# Patient Record
Sex: Male | Born: 1970 | Race: White | Hispanic: No | Marital: Married | Smoking: Current some day smoker
Health system: Southern US, Community
[De-identification: ages and names within clinical notes are randomized; demographics above are authoritative.]

## PROBLEM LIST (undated history)

## (undated) DIAGNOSIS — I209 Angina pectoris, unspecified: Secondary | ICD-10-CM

## (undated) DIAGNOSIS — R7303 Prediabetes: Secondary | ICD-10-CM

## (undated) DIAGNOSIS — I10 Essential (primary) hypertension: Secondary | ICD-10-CM

## (undated) DIAGNOSIS — K219 Gastro-esophageal reflux disease without esophagitis: Secondary | ICD-10-CM

## (undated) HISTORY — PX: CARDIAC CATHETERIZATION: SHX172

## (undated) HISTORY — PX: ANTERIOR CRUCIATE LIGAMENT REPAIR: SHX115

---

## 2001-02-14 ENCOUNTER — Ambulatory Visit (HOSPITAL_COMMUNITY): Admission: RE | Admit: 2001-02-14 | Discharge: 2001-02-14 | Payer: Self-pay | Admitting: Family Medicine

## 2001-02-14 ENCOUNTER — Encounter: Payer: Self-pay | Admitting: Family Medicine

## 2001-02-17 ENCOUNTER — Encounter: Payer: Self-pay | Admitting: Family Medicine

## 2001-02-17 ENCOUNTER — Ambulatory Visit (HOSPITAL_COMMUNITY): Admission: RE | Admit: 2001-02-17 | Discharge: 2001-02-17 | Payer: Self-pay | Admitting: Family Medicine

## 2001-09-21 ENCOUNTER — Encounter: Payer: Self-pay | Admitting: Family Medicine

## 2001-09-21 ENCOUNTER — Ambulatory Visit (HOSPITAL_COMMUNITY): Admission: RE | Admit: 2001-09-21 | Discharge: 2001-09-21 | Payer: Self-pay | Admitting: Family Medicine

## 2003-07-16 ENCOUNTER — Ambulatory Visit (HOSPITAL_COMMUNITY): Admission: RE | Admit: 2003-07-16 | Discharge: 2003-07-16 | Payer: Self-pay | Admitting: Family Medicine

## 2004-01-24 ENCOUNTER — Ambulatory Visit (HOSPITAL_COMMUNITY): Admission: RE | Admit: 2004-01-24 | Discharge: 2004-01-24 | Payer: Self-pay | Admitting: Family Medicine

## 2004-03-06 ENCOUNTER — Ambulatory Visit (HOSPITAL_COMMUNITY): Admission: RE | Admit: 2004-03-06 | Discharge: 2004-03-06 | Payer: Self-pay | Admitting: Internal Medicine

## 2005-02-02 ENCOUNTER — Ambulatory Visit (HOSPITAL_COMMUNITY): Admission: RE | Admit: 2005-02-02 | Discharge: 2005-02-02 | Payer: Self-pay | Admitting: Family Medicine

## 2005-09-21 ENCOUNTER — Emergency Department (HOSPITAL_COMMUNITY): Admission: EM | Admit: 2005-09-21 | Discharge: 2005-09-21 | Payer: Self-pay | Admitting: Emergency Medicine

## 2008-05-22 ENCOUNTER — Ambulatory Visit (HOSPITAL_COMMUNITY): Admission: RE | Admit: 2008-05-22 | Discharge: 2008-05-22 | Payer: Self-pay | Admitting: Family Medicine

## 2008-05-31 ENCOUNTER — Ambulatory Visit (HOSPITAL_COMMUNITY): Admission: RE | Admit: 2008-05-31 | Discharge: 2008-05-31 | Payer: Self-pay | Admitting: Family Medicine

## 2010-10-02 NOTE — Consult Note (Signed)
NAME:  Shannon Ritter, Shannon Ritter                    ACCOUNT NO.:  [id number]   MEDICAL RECORD NO.:  [id number]          PATIENT TYPE:  OUT   LOCATION:  RDC                           FACILITY:  [facility]   PHYSICIAN:  R. Roetta Sessions, M.D. DATE OF BIRTH:  13-Jul-1970   DATE OF CONSULTATION:  02/27/2004  DATE OF DISCHARGE:  01/24/2004                                   CONSULTATION   REASON FOR CONSULTATION:  Hematochezia.   HISTORY OF PRESENT ILLNESS:  This patient is a pleasant, 40 year old  Caucasian male sent over at the courtesy of Dr. Karleen Hampshire to further  evaluate a 1-year history of intermittent gross blood per rectum.  The  patient tells me that he has alternating constipation from time to time,  particularly when he has diarrhea sometimes he has blood per rectum  associated with loose stools.  He tells me that he may have had these  symptoms now for over a year.  They are intermittent; they are not every  day, but a couple of times weekly.  He has not had any associated abdominal  pain, although sometimes he does have some mild cramping after he eats.   FAMILY HISTORY:  Family history is significant in that a paternal uncle was  recently diagnosed with colorectal cancer in his early 48s.  Patient has  never had his lower GI tract imaged. He does not have any upper GI tract  symptoms such as odynophagia, dysphagia, early satiety, or reflux symptoms,  nausea or vomiting.  He has not lost any weight.   PAST MEDICAL HISTORY:  Significant for hypertension and insomnia.   PAST SURGERIES:  None.   CURRENT MEDICATIONS:  1.  Teoprolol 100 mg daily.  2.  Xanax 1 mg q.h.s. p.r.n. sleep.   ALLERGIES:  No known drug allergies.   FAMILY HISTORY:  Father has lung cancer.  Mother has high blood pressure and  diabetes.   SOCIAL HISTORY:  The patient is single.  He is a Theme park manager.  He  smokes 1 pack of cigarettes every other day.  He drinks a 12-pack of beer,  at one time, approximately  once weekly.   REVIEW OF SYSTEMS:  No chest pain, no dyspnea on exertion.  No fever or  chills.  Otherwise, as in history of present illness.   PHYSICAL EXAMINATION:  GENERAL:  Reveals a pleasant, 40 year old gentleman  with a goatee resting comfortably.  VITAL SIGNS:  Weight 197, height 5 feet 11 inches.  Temperature 98.1, BP  130/90, pulse 64.  SKIN:  Warm and dry.  No jaundice.  No cutaneous stigmata of chronic liver  disease.  HEENT:  No scleral icterus.  Conjunctivae are pink.  Oral cavity no lesions.  NECK:  JVD is not prominent.  CHEST:  Lungs are clear to auscultation.  CARDIAC:  Regular rate and rhythm without murmur, gallop, or rub.  ABDOMEN:  Nondistended, positive bowel sounds.  Soft, nontender, without  appreciable mass or organomegaly.  EXTREMITIES:  No edema.  RECTAL:  Deferred to the time of colonoscopy.  IMPRESSION:  This patient is a pleasant 40 year old gentleman with  intermittent rectal bleeding (gross blood per rectum) in association with  alternating constipation and diarrhea.  It sounds as though his bleeding is  coming from the distal lower gastrointestinal tract.  We need to rule out  other significant pathology.  I doubt his bleeding is secondary to colitis;  but, again, he needs further investigation.  He has a positive family  history of colorectal neoplasia in a second-degree relative.   RECOMMENDATIONS:  1.  Colonoscopy in the near future at [facility]. I have discussed      the potential risks, benefits, and alternatives.  His questions were      answered.  He is agreeable. I will set this up in the very near future.  2.  Further recommendations to follow.   I would like to thank Dr. Karleen Hampshire for allowing me to see this nice  gentleman today.     Otelia Sergeant   RMR/MEDQ  D:  02/27/2004  T:  02/27/2004  Job:  [id number]   cc:   Kirk Ruths, M.D.  [address]  [place]  [place] [address]  Fax: [phone]   R. Roetta Sessions, M.D.   [address]  [place]  [place] [address]  Fax: [phone]

## 2010-10-02 NOTE — Op Note (Signed)
NAME:  Shannon Ritter, Shannon Ritter                    ACCOUNT NO.:  [id number]   MEDICAL RECORD NO.:  [id number]          PATIENT TYPE:  AMB   LOCATION:  DAY                           FACILITY:  [facility]   PHYSICIAN:  R. Roetta Sessions, M.D. DATE OF BIRTH:  [birth year]   DATE OF PROCEDURE:  03/06/2004  DATE OF DISCHARGE:                                 OPERATIVE REPORT   PROCEDURE:  Colonoscopy with biopsy.   INDICATIONS FOR PROCEDURE:  The patient is a 40 year old gentleman with  hematochezia, worse when he consumes significant quantities of beer. No  family history of colorectal neoplasia. Colonoscopy is now being done. This  approach has been discussed with the patient at length. Potential risks,  benefits, and alternatives have been reviewed and questions answered. He is  agreeable. Please see documentation in medical record for more information.   PROCEDURE NOTE:  O2 saturation, blood pressure, pulse, and respirations  monitored throughout the entirety of the procedure. Conscious sedation with  Versed 5 mg IV and Demerol 100 mg IV in divided doses.   INSTRUMENT:  Olympus video chip system.   FINDINGS:  Digital rectal examination revealed no abnormalities.   ENDOSCOPIC FINDINGS:  Prep was good.   Rectum:  Examination of the rectal mucosa including retroflexed view of anal  verge revealed  only some internal hemorrhoids.   Colon:  Colonic mucosa was surveyed from the rectosigmoid junction through  the left, transverse, and right colon to area of the appendiceal orifice,  ileocecal valve, and cecum. These structures were well seen and photographed  for the record. Olympus video scope was slowly withdrawn, and all previously  mentioned mucosal surfaces were again seen. The patient was noted to have a  3-mm polyp at the base of the cecum which was cold biopsied/removed.  Remainder of colonic mucosa appeared normal. The patient tolerated the  procedure well and was reactive to endoscopy.    IMPRESSION:  1.  Internal hemorrhoids, otherwise normal rectum.  2.  Diminutive polyp in the cecum, cold biopsied/removed. Remainder of      colonic mucosa appeared normal.  3.  I suspect the patient has bleed from hemorrhoids intermittently.   RECOMMENDATIONS:  1.  Hemorrhoid literature provided to Mr. Wollard.  2.  A 10-day course of Anusol HC suppositories 1 per rectum at bedtime.  3.  Follow up on pathology.  4.  Further recommendations to follow.     Otelia Sergeant   RMR/MEDQ  D:  03/06/2004  T:  03/06/2004  Job:  [id number]   cc:   Kirk Ruths, M.D.  [address]  [place]  [place] [address]  Fax: [phone]

## 2010-10-02 NOTE — H&P (Signed)
NAMEDONNALD, TABAR                    ACCOUNT NO.:  [id number]   MEDICAL RECORD NO.:  [id number]           PATIENT TYPE:   LOCATION:                                 FACILITY:   PHYSICIAN:  Vonna Kotyk R. Jacinto Halim, MD       DATE OF BIRTH:  29-Sep-1970   DATE OF ADMISSION:  09/21/2005  DATE OF DISCHARGE:                                HISTORY & PHYSICAL   CHIEF COMPLAINTS:  Chest pain.   HISTORY OF PRESENT ILLNESS:  The patient is a 40 year old male who was  referred to [facility] ER today for chest pain.  He was transferred from Dr.  Sharyon Medicus office by ambulance.  He has no prior history of coronary disease or  MI, but he was admitted to a hospital in [place] for chest pain a  couple years ago.  He was diagnosed with gastroesophageal reflux.  He thinks  he had a stress test a couple of years ago in [place], but we have no  records of that.  He is seen today for a left-sided chest pain.  Symptoms  have been present since Sunday.  Symptoms were worse with palpation.  He had  some left arm numbness and posterior neck discomfort associated with it.  He  has had no significant dyspnea.  His EKG at Dr. Sharyon Medicus office was mildly  abnormal with T-wave inversion in aVF.   PAST MEDICAL HISTORY:  Remarkable for:  1.  Hypertension.  2.  Gastroesophageal reflux.  3.  Hyperlipidemia which is untreated.   CURRENT MEDICATIONS:  1.  Toprol XL 100 mg a day.  2.  Protonix p.r.n.  3.  Xanax p.r.n..   He has no known drug allergies.   SOCIAL HISTORY:  He is engaged.  He works in Airline pilot at [employer].  He is  smokes occasionally.  He does drink significant amount of alcohol.  Apparently he had 18 beers this weekend.   FAMILY HISTORY:  Remarkable for hypertension but no coronary disease.   REVIEW OF SYSTEMS:  Essentially unremarkable except for above.  He denies  any GI bleeding or melena.  He has not had syncope or tachycardia.  He has  had occasional palpitations.   PHYSICAL EXAMINATION:  VITAL SIGNS:  Blood pressure 132/88, pulse 68,  respirations 12.  GENERAL:  He is a well-developed, well-nourished male in no acute distress.  HEENT: Normocephalic, atraumatic.  Lids and conjunctivae within normal  limits.  NECK:  Without bruits and without JVD.  CHEST: Clear to auscultation and percussion. He does have some tenderness to  palpation in the left anterior chest.  CARDIAC:  Reveals regular rate and rhythm without obvious murmur, rub or  gallop.  Normal S1 and S2.  ABDOMEN:  Nontender.  No hepatosplenomegaly.  EXTREMITIES: Without edema.  Distal pulses are 3+/4 bilaterally.  NEUROLOGIC: Exam exam is grossly intact.   EKG shows sinus rhythm with nonspecific ST changes.   IMPRESSION:  1.  Chest pain, some atypical features.  2.  Hypertension.  3.  Abnormal electrocardiogram at his primary care's office, now  showing      nonspecific ST changes.  4.  Heavy alcohol use.  5.  Hyperlipidemia by history  6.  History of smoking.   PLAN:  The patient was seen by Dr. Jacinto Halim and myself today in the ER.  We  obtained labs, chest x-ray, and EKG.  Will go ahead and get point-of-care  markers.  If his troponins are negative, he will be discharged and will come  to the office this Thursday for a stress test.  It is okay for him to hold  his beta blocker until them.      Abelino Derrick, P.A.      Cristy Hilts. Jacinto Halim, MD  Electronically Signed    LKK/MEDQ  D:  09/21/2005  T:  09/21/2005  Job:  [id number]   cc:   Kirk Ruths, M.D.  Fax: [phone]

## 2011-06-14 ENCOUNTER — Other Ambulatory Visit: Payer: Self-pay | Admitting: Cardiology

## 2011-06-14 ENCOUNTER — Ambulatory Visit
Admission: RE | Admit: 2011-06-14 | Discharge: 2011-06-14 | Disposition: A | Discharge status: Home / Self Care | Payer: Managed Care, Other (non HMO) | Source: Ambulatory Visit | Attending: Cardiology | Admitting: Cardiology

## 2011-12-20 DIAGNOSIS — R42 Dizziness and giddiness: Secondary | ICD-10-CM

## 2015-04-01 ENCOUNTER — Ambulatory Visit (HOSPITAL_COMMUNITY)
Admission: RE | Admit: 2015-04-01 | Discharge: 2015-04-01 | Disposition: A | Discharge status: Home / Self Care | Payer: Managed Care, Other (non HMO) | Source: Ambulatory Visit | Attending: Family Medicine | Admitting: Family Medicine

## 2015-04-01 ENCOUNTER — Other Ambulatory Visit (HOSPITAL_COMMUNITY): Payer: Self-pay | Admitting: Family Medicine

## 2015-04-01 DIAGNOSIS — S70352S Superficial foreign body, left thigh, sequela: Secondary | ICD-10-CM | POA: Diagnosis not present

## 2015-04-01 DIAGNOSIS — W208XXS Other cause of strike by thrown, projected or falling object, sequela: Secondary | ICD-10-CM | POA: Insufficient documentation

## 2016-01-08 ENCOUNTER — Ambulatory Visit: Payer: Managed Care, Other (non HMO) | Admitting: Cardiovascular Disease

## 2016-04-20 ENCOUNTER — Telehealth: Payer: Self-pay | Admitting: Cardiovascular Disease

## 2016-04-20 NOTE — Telephone Encounter (Signed)
Received records from [facility] for appointment on 04/30/16 with Dr Gwenlyn Found.  Records given to [facility] (medical records) for Dr Kennon Holter schedule on 04/30/16. lp

## 2016-04-30 ENCOUNTER — Encounter: Payer: Self-pay | Admitting: Cardiovascular Disease

## 2016-04-30 ENCOUNTER — Ambulatory Visit (INDEPENDENT_AMBULATORY_CARE_PROVIDER_SITE_OTHER): Payer: Managed Care, Other (non HMO) | Admitting: Cardiovascular Disease

## 2016-04-30 VITALS — BP 152/98 | HR 81 | Ht 71.0 in | Wt 208.0 lb

## 2016-04-30 DIAGNOSIS — E7800 Pure hypercholesterolemia, unspecified: Secondary | ICD-10-CM

## 2016-04-30 DIAGNOSIS — R079 Chest pain, unspecified: Secondary | ICD-10-CM | POA: Insufficient documentation

## 2016-04-30 DIAGNOSIS — I2089 Other forms of angina pectoris: Secondary | ICD-10-CM

## 2016-04-30 DIAGNOSIS — I208 Other forms of angina pectoris: Secondary | ICD-10-CM

## 2016-04-30 DIAGNOSIS — E785 Hyperlipidemia, unspecified: Secondary | ICD-10-CM | POA: Insufficient documentation

## 2016-04-30 DIAGNOSIS — I10 Essential (primary) hypertension: Secondary | ICD-10-CM

## 2016-04-30 MED ORDER — BLOOD PRESSURE MONITOR/ARM DEVI: 1.0000 | Freq: Every day | 0 refills | Status: DC

## 2016-04-30 NOTE — Assessment & Plan Note (Signed)
History of atypical chest pain with chronic Rosacea performed 10 years ago by Dr. Nadyne Coombes  which is apparently normal. He had admission at [facility] June for chest pain rule out myocardial infarction. A stress echo was normal performed 11/11/15. He is on Protonix which she has not taken.

## 2016-04-30 NOTE — Progress Notes (Signed)
04/30/2016 LAQUINTON TOOLE   11/14/1970  [id number]  Primary Physician Glo Herring., MD Primary Cardiologist: Lorretta Harp MD Renae Gloss  HPI:  Mr. Wo is a 45 year old mildly overweight married Caucasian male father to accompany by his wife Helene Kelp. He is referred by Dr. Riley Kill for cardiovascular evaluation because of hypertension and atypical chest pain. Suspect factors include occasional tobacco abuse of 4 cigarettes a day when he drinks several times a week. History of hypertension and hyperlipidemia. He has no had a heart attack or stroke. He apparently underwent cardiac catheterization by Dr. Einar Gip 10 years ago and had no evidence of CAD. He has had off-and-on chest pain since that time. He is in a high stress job selling homes. He had a chest pain rule out myocardial infarction admission at [facility] in June of this year. At stress echo performed on 11/11/2015 was normal.   Current Outpatient Prescriptions  Medication Sig Dispense Refill  . aspirin 81 MG tablet Take 81 mg by mouth daily.    Marland Kitchen atorvastatin (LIPITOR) 40 MG tablet Take 40 mg by mouth daily.    Marland Kitchen lisinopril (PRINIVIL,ZESTRIL) 20 MG tablet Take 20 mg by mouth daily.    . Multiple Vitamins-Minerals (MULTIVITAMIN ADULT PO) Take 1 tablet by mouth daily.    Marland Kitchen ALPRAZolam (XANAX) 1 MG tablet Take 1 mg by mouth at bedtime.    . Blood Pressure Monitoring (BLOOD PRESSURE MONITOR/ARM) DEVI 1 Device by Does not apply route daily. 1 Device 0  . pantoprazole (PROTONIX) 40 MG tablet Take 40 mg by mouth daily.     No current facility-administered medications for this visit.     Not on File  Social History   Social History  . Marital status: Legally Separated    Spouse name: N/A  . Number of children: N/A  . Years of education: N/A   Occupational History  . Not on file.   Social History Main Topics  . Smoking status: Not on file  . Smokeless tobacco: Not on file  . Alcohol use Not on  file  . Drug use: Unknown  . Sexual activity: Not on file   Other Topics Concern  . Not on file   Social History Narrative  . No narrative on file     Review of Systems: General: negative for chills, fever, night sweats or weight changes.  Cardiovascular: negative for chest pain, dyspnea on exertion, edema, orthopnea, palpitations, paroxysmal nocturnal dyspnea or shortness of breath Dermatological: negative for rash Respiratory: negative for cough or wheezing Urologic: negative for hematuria Abdominal: negative for nausea, vomiting, diarrhea, bright red blood per rectum, melena, or hematemesis Neurologic: negative for visual changes, syncope, or dizziness All other systems reviewed and are otherwise negative except as noted above.    Blood pressure (!) 152/98, pulse 81, height 5\' 11"  (1.803 m), weight 208 lb (94.3 kg).  General appearance: alert and no distress Neck: no adenopathy, no carotid bruit, no JVD, supple, symmetrical, trachea midline and thyroid not enlarged, symmetric, no tenderness/mass/nodules Lungs: clear to auscultation bilaterally Heart: regular rate and rhythm, S1, S2 normal, no murmur, click, rub or gallop Extremities: extremities normal, atraumatic, no cyanosis or edema  EKG sinus rhythm 81 without ST or T-wave changes. I personally reviewed this EKG  ASSESSMENT AND PLAN:   Essential hypertension History of hypertension with blood pressures measured today at 152/98. He is on low-dose lisinopril. He is clearly mildly overweight, does not exercise and has  a fairly poor diet. I've asked him to purchase a home digital blood pressure cuff and keep a blood pressure log over the next one month. He will see Erasmo Downer back after that to make further recommendations  Hyperlipidemia History of hyperlipidemia on statin therapy. He has a poor diet. We will recheck a lipid liver profile in 2 months.    Chest pain History of atypical chest pain with chronic Rosacea  performed 10 years ago by Dr. Nadyne Coombes  which is apparently normal. He had admission [facility] for chest pain rule out myocardial infarction. A stress echo was normal performed 11/11/15. He is on Protonix which she has not taken.      Lorretta Harp MD FACP,FACC,FAHA, [facility] 04/30/2016 3:49 PM

## 2016-04-30 NOTE — Patient Instructions (Signed)
Medication Instructions: Your physician recommends that you continue on your current medications as directed. Please refer to the Current Medication list given to you today.  I sent a prescription to your pharmacy for a BP monitor. Your physician has requested that you regularly monitor and record your blood pressure readings at home. Please use the same machine at the same time of day to check your readings and record them to bring to your follow-up visit.  Labwork: Your physician recommends that you return for a FASTING lipid profile and hepatic function panel in 2 months.   Follow-Up: Your physician recommends that you schedule a follow-up appointment in 1 month with Pharmacy.  Your physician wants you to follow-up in: 6 months with Dr. Gwenlyn Found. You will receive a reminder letter in the mail two months in advance. If you don't receive a letter, please call our office to schedule the follow-up appointment.  If you need a refill on your cardiac medications before your next appointment, please call your pharmacy.

## 2016-04-30 NOTE — Assessment & Plan Note (Signed)
History of hypertension with blood pressures measured today at 152/98. He is on low-dose lisinopril. He is clearly mildly overweight, does not exercise and has a fairly poor diet. I've asked him to purchase a home digital blood pressure cuff and keep a blood pressure log over the next one month. He will see Erasmo Downer back after that to make further recommendations

## 2016-04-30 NOTE — Assessment & Plan Note (Signed)
History of hyperlipidemia on statin therapy. He has a poor diet. We will recheck a lipid liver profile in 2 months.

## 2016-05-03 ENCOUNTER — Other Ambulatory Visit: Payer: Self-pay | Admitting: Cardiovascular Disease

## 2016-05-03 DIAGNOSIS — I10 Essential (primary) hypertension: Secondary | ICD-10-CM

## 2016-05-03 MED ORDER — BLOOD PRESSURE MONITOR/ARM DEVI: 1.0000 | Freq: Once | 0 refills | Status: DC

## 2016-05-26 ENCOUNTER — Ambulatory Visit: Payer: Managed Care, Other (non HMO) | Admitting: Cardiovascular Disease

## 2016-05-27 ENCOUNTER — Telehealth: Payer: Self-pay | Admitting: Cardiovascular Disease

## 2016-05-27 NOTE — Telephone Encounter (Signed)
New message   Pt verbalized that he is calling to speak to rn about medication no issues- he has a week worth of readings and pt said that he is suppose to take a full month

## 2016-05-27 NOTE — Telephone Encounter (Signed)
Returned call to patient-patient reports he has appt on 1/16 with pharmacist for BP check but has only been able to monitor BP at home for about a week due to issues getting a BP cuff.  Patient wondering if we need to move appt to a later date so he has more BP readings.  Patient also reports concern with BP cuff accuracy at home.   Patient will be out of town week of Jan 22-Advised patient I could move appointment to the end of next week so he would have atleast 2 weeks of BP readings.  Appointment rescheduled.  Also advised to bring BP cuff with him to see if it is accurate.  Pt agreed and verbalized understanding.

## 2016-06-01 ENCOUNTER — Ambulatory Visit: Payer: Managed Care, Other (non HMO)

## 2016-06-04 ENCOUNTER — Ambulatory Visit (INDEPENDENT_AMBULATORY_CARE_PROVIDER_SITE_OTHER): Payer: Managed Care, Other (non HMO) | Admitting: Pharmacist Clinician (PhC)/ Clinical Pharmacy Specialist

## 2016-06-04 DIAGNOSIS — I10 Essential (primary) hypertension: Secondary | ICD-10-CM | POA: Diagnosis not present

## 2016-06-04 NOTE — Patient Instructions (Addendum)
Return for a a follow up appointment in 4 weeks  Your blood pressure today is 108/82  Check your blood pressure at home daily and keep record of the readings.  Take your BP meds as follows:  Continue with lisinopril daily  Bring all of your meds, your BP cuff and your record of home blood pressures to your next appointment.  Exercise as you're able, try to walk approximately 30 minutes per day.  Keep salt intake to a minimum, especially watch canned and prepared boxed foods.  Eat more fresh fruits and vegetables and fewer canned items.  Avoid eating in fast food restaurants.    HOW TO TAKE YOUR BLOOD PRESSURE: . Rest 5 minutes before taking your blood pressure. .  Don't smoke or drink caffeinated beverages for at least 30 minutes before. . Take your blood pressure before (not after) you eat. . Sit comfortably with your back supported and both feet on the floor (don't cross your legs). . Elevate your arm to heart level on a table or a desk. . Use the proper sized cuff. It should fit smoothly and snugly around your bare upper arm. There should be enough room to slip a fingertip under the cuff. The bottom edge of the cuff should be 1 inch above the crease of the elbow. . Ideally, take 3 measurements at one sitting and record the average.   0

## 2016-06-04 NOTE — Progress Notes (Signed)
     06/04/2016 Shannon Ritter 11-May-1971 [id number]   HPI:  Shannon Ritter is a 46 y.o. male patient of Dr Gwenlyn Found, with a PMH below who presents today for hypertension clinic evaluation.  He reports a history of hypertension going back about 10-15 years, although he admits to not paying attention to readings for many of those years.  When he saw Dr. Gwenlyn Found his pressure was 152/98.  No changes were made to his medication (lisinopirl 20 mg daily), but he was encouraged to lose weight and increase his exercise.  He actually did this, and notes a 10 pound weight loss over the past month, as well as having started using an exercise bike most days of the week.    Blood Pressure Goal:  130/80   Current Medications:  Lisinopril 20 mg daily  Cardiac Hx:  Hypertension, hyperlipidemia  Family Hx:  Mother has hypertension and diabetes.  Father had double lung transplant around age 57, lived for 15 years before dying of cancer.  Social Hx:  Previously was drinking 3-4 beers per day.   Since seeing MD has cut back, states has not had a beer in 12 days.  (although he will be in [facility] next week and admits will be doing some drinking);   Diet:  Has improved greatly since seeing MD.  Dinner mostly at home with his family, has switched to salmon and [place], cut out fried foods for the most part.  Previously lunch was fast food, now choosing [employer] healthy grilled meats or other non-fried meats from other fast food.    Exercise:  Recently got exercise bike, rides for 45 minutes most days  Home BP readings:  Borrowed cuff from his mother, admits it is probably 20-58 years old.  Average reading (of 16) 135/92, with a range of 120-158/81-100.  Of note, in the past 6 readings only 1 had systolic > [id number].    Intolerances:   none  Wt Readings from Last 3 Encounters:  04/30/16 208 lb (94.3 kg)   BP Readings from Last 3 Encounters:  04/30/16 (!) 152/98   Pulse Readings from Last 3 Encounters:  04/30/16 81     Current Outpatient Prescriptions  Medication Sig Dispense Refill  . ALPRAZolam (XANAX) 1 MG tablet Take 1 mg by mouth at bedtime.    Marland Kitchen aspirin 81 MG tablet Take 81 mg by mouth daily.    Marland Kitchen atorvastatin (LIPITOR) 40 MG tablet Take 40 mg by mouth daily.    . Blood Pressure Monitoring (BLOOD PRESSURE MONITOR/ARM) DEVI 1 Device by Does not apply route once. 1 Device 0  . lisinopril (PRINIVIL,ZESTRIL) 20 MG tablet Take 20 mg by mouth daily.    . Multiple Vitamins-Minerals (MULTIVITAMIN ADULT PO) Take 1 tablet by mouth daily.    . pantoprazole (PROTONIX) 40 MG tablet Take 40 mg by mouth daily.     No current facility-administered medications for this visit.     Not on File  No past medical history on file.  There were no vitals taken for this visit.  No problem-specific Assessment & Plan notes found for this encounter.   Tommy Medal PharmD CPP [facility]

## 2016-06-04 NOTE — Assessment & Plan Note (Signed)
Patient with hypertension, no problems with compliance or complaints about medication.  He does endorse a cough/tickle in his throat, but states it is not problematic, and he has continual problems with allergies/sinuses.  Pressure in the office much improved, and he was praised on his weight loss and dietary changes.  I will have him continue to monitor home BP readings and see him again in 1 month.   Should he continue to lose weight and exercise, we may be able to actually decrease his lisinopril dose.

## 2016-06-07 ENCOUNTER — Encounter: Payer: Self-pay | Admitting: Pharmacist Clinician (PhC)/ Clinical Pharmacy Specialist

## 2016-07-02 ENCOUNTER — Ambulatory Visit: Payer: Managed Care, Other (non HMO)

## 2016-07-21 ENCOUNTER — Ambulatory Visit (INDEPENDENT_AMBULATORY_CARE_PROVIDER_SITE_OTHER): Payer: Managed Care, Other (non HMO) | Admitting: Pharmacist

## 2016-07-21 VITALS — BP 130/92 | HR 77

## 2016-07-21 DIAGNOSIS — I10 Essential (primary) hypertension: Secondary | ICD-10-CM

## 2016-07-21 NOTE — Progress Notes (Signed)
Patient ID: Shannon Ritter                 DOB: 1970/05/22                      MRN: [id number]     HPI: Shannon Ritter is a 46 y.o. male patient of Dr Gwenlyn Found, with a PMH relevant for hypertension and hyperlipidemia.  He reports a history of hypertension going back about 10-15 years, although he admits to not paying attention to readings for many of those years.  When he saw Dr. Gwenlyn Found his pressure was 152/98.  No changes were made to his medication (Lisinopril 20 mg daily), but he was encouraged to lose weight and increase his exercise.  He is currently following up with a nutritionist and report a weight loss of 22 pound (19% of total body wt) over  2 months.  Continues to exercise with stationary bike 2-3 times a week for 40-45 minutes. Alcohol intake also significantly decreased from 3-4 beers a day to only few drink occasionally (twice in the last 2 months).  Presents to clinic today for HTN follow up and medication management. Denies headaches, fatigue or swelling. Occasional dizziness reported but never complete loss of balance.  Patient dislike taking medication and will like to decrease amount of daily medication he takes.  Blood Pressure Goal:  130/80   Current Medications: Lisinopril 20 mg daily  Family History: Mother has hypertension and diabetes.  Father had double lung transplant around age 56, lived for 15 years before dying of cancer.  Social Hx: Previously was drinking 3-4 beers per day.   Since seeing MD has cut back, states has not had a beer in 12 days.  (although he will be in [facility] next week and admits will be doing some drinking);   Diet: Has improved greatly since seeing MD.  Dinner mostly at home with his family, has switched to salmon and [place], cut out fried foods for the most part.  Previously lunch was fast food, now choosing [employer] healthy grilled meats or other non-fried meats from other fast food.    Exercise: Recently got exercise bike, rides for 45 minutes  most days  Home BP readings: 22 reading; 123/87 average (range 107-145/76-102) **Noted BP elevated the day after significant EtOH intake**  Wt Readings from Last 3 Encounters:  04/30/16 208 lb (94.3 kg)   BP Readings from Last 3 Encounters:  07/21/16 (!) 130/92  04/30/16 (!) 152/98   Pulse Readings from Last 3 Encounters:  07/21/16 77  04/30/16 81     Current Outpatient Prescriptions on File Prior to Visit  Medication Sig Dispense Refill  . ALPRAZolam (XANAX) 1 MG tablet Take 1 mg by mouth at bedtime.    Marland Kitchen aspirin 81 MG tablet Take 81 mg by mouth daily.    Marland Kitchen atorvastatin (LIPITOR) 40 MG tablet Take 40 mg by mouth daily.    . Blood Pressure Monitoring (BLOOD PRESSURE MONITOR/ARM) DEVI 1 Device by Does not apply route once. 1 Device 0  . lisinopril (PRINIVIL,ZESTRIL) 20 MG tablet Take 20 mg by mouth daily.    . Multiple Vitamins-Minerals (MULTIVITAMIN ADULT PO) Take 1 tablet by mouth daily.    . pantoprazole (PROTONIX) 40 MG tablet Take 40 mg by mouth daily.     No current facility-administered medications on file prior to visit.     Blood pressure (!) 130/92, pulse 77, SpO2 95 %.  Essential hypertension:  Blood  pressure today slightly above desired goal but significantly improved from previous readings. Patient stated his BP is always elevated after EtOH consumption and had few drinks last night.  Home BP readings are at goal with previously calibrated home device and considered to be accurate with average of 123/87.  Also noted occasional dizziness on standing reported. Patient insistent on decreasing the amount of "pills he takes every day".  He is currently following all life style modifications recommended including significant weight loss of 22 lbs so far.   Will decrease his Lisinopril from 20mg  to 10mg  daily and decrease Protonix frequency from twice daily to once daily.  Follow up with HTN clinic in 1 months to consider Lisinopril discontinuation if possible.  Kyisha Fowle  Rodriguez-Guzman PharmD, [place] [id number] 07/22/2016 8:52 AM

## 2016-07-21 NOTE — Patient Instructions (Addendum)
Return for a  follow up appointment in 1 months  Your blood pressure today is 130/92 pulse 77  Check your blood pressure at home daily (if able) and keep record of the readings.  Take your BP meds as follows: Decrease lisinopril to 10mg  daily (1/2 tablet of 20 mg until supply completed)  **Repeat blood work (fasting) within 1 week **Decrease Protonix to once daily for 1 week, then discontinue if no reflux    Bring all of your meds, your BP cuff and your record of home blood pressures to your next appointment.  Exercise as you're able, try to walk approximately 30 minutes per day.  Keep salt intake to a minimum, especially watch canned and prepared boxed foods.  Eat more fresh fruits and vegetables and fewer canned items.  Avoid eating in fast food restaurants.    HOW TO TAKE YOUR BLOOD PRESSURE: . Rest 5 minutes before taking your blood pressure. .  Don't smoke or drink caffeinated beverages for at least 30 minutes before. . Take your blood pressure before (not after) you eat. . Sit comfortably with your back supported and both feet on the floor (don't cross your legs). . Elevate your arm to heart level on a table or a desk. . Use the proper sized cuff. It should fit smoothly and snugly around your bare upper arm. There should be enough room to slip a fingertip under the cuff. The bottom edge of the cuff should be 1 inch above the crease of the elbow. . Ideally, take 3 measurements at one sitting and record the average.

## 2016-08-18 ENCOUNTER — Ambulatory Visit: Payer: Managed Care, Other (non HMO)

## 2016-08-18 NOTE — Progress Notes (Deleted)
Patient ID: Shannon Ritter                 DOB: Feb 25, 1971                      MRN: [id number]     HPI: MARKON JARES is a 46 y.o. male patient of Dr Gwenlyn Found, with a PMH relevant for hypertension and hyperlipidemia.  He reports a history of hypertension going back about 10-15 years, although he admits to not paying attention to readings for many of those years.  When he saw Dr. Gwenlyn Found his pressure was 152/98.  No changes were made to his medication (Lisinopril 20 mg daily), but he was encouraged to lose weight and increase his exercise.  He is currently following up with a nutritionist and report a weight loss of 22 pound (19% of total body wt) over  2 months.  Continues to exercise with stationary bike 2-3 times a week for 40-45 minutes. Alcohol intake also significantly decreased from 3-4 beers a day to only few drink occasionally (twice in the last 2 months).  Presents to clinic today for HTN follow up and medication management. Denies headaches, fatigue or swelling. Occasional dizziness reported but never complete loss of balance.  Patient dislike taking medication and will like to decrease amount of daily medication he takes.  Blood Pressure Goal:  130/80   Current Medications: Lisinopril 10 mg daily  Family History: Mother has hypertension and diabetes.  Father had double lung transplant around age 69, lived for 15 years before dying of cancer.  Social Hx: Previously was drinking 3-4 beers per day.   Since seeing MD has cut back, states has not had a beer in 12 days.  (although he will be in [facility] next week and admits will be doing some drinking);   Diet: Has improved greatly since seeing MD.  Dinner mostly at home with his family, has switched to salmon and [place], cut out fried foods for the most part.  Previously lunch was fast food, now choosing [employer] healthy grilled meats or other non-fried meats from other fast food.    Exercise: Recently got exercise bike, rides for 45 minutes  most days  Home BP readings: 22 reading; 123/87 average (range 107-145/76-102) **Noted BP elevated the day after significant EtOH intake**  Wt Readings from Last 3 Encounters:  04/30/16 208 lb (94.3 kg)   BP Readings from Last 3 Encounters:  07/21/16 (!) 130/92  04/30/16 (!) 152/98   Pulse Readings from Last 3 Encounters:  07/21/16 77  04/30/16 81     Current Outpatient Prescriptions on File Prior to Visit  Medication Sig Dispense Refill  . ALPRAZolam (XANAX) 1 MG tablet Take 1 mg by mouth at bedtime.    Marland Kitchen aspirin 81 MG tablet Take 81 mg by mouth daily.    Marland Kitchen atorvastatin (LIPITOR) 40 MG tablet Take 40 mg by mouth daily.    . Blood Pressure Monitoring (BLOOD PRESSURE MONITOR/ARM) DEVI 1 Device by Does not apply route once. 1 Device 0  . lisinopril (PRINIVIL,ZESTRIL) 20 MG tablet Take 10 mg by mouth daily.    . Multiple Vitamins-Minerals (MULTIVITAMIN ADULT PO) Take 1 tablet by mouth daily.    . pantoprazole (PROTONIX) 40 MG tablet Take 40 mg by mouth daily.     No current facility-administered medications on file prior to visit.     There were no vitals taken for this visit.  Essential hypertension:  Blood pressure  today slightly above desired goal but significantly improved from previous readings. Patient stated his BP is always elevated after EtOH consumption and had few drinks last night.  Home BP readings are at goal with previously calibrated home device and considered to be accurate with average of 123/87.  Also noted occasional dizziness on standing reported. Patient insistent on decreasing the amount of "pills he takes every day".  He is currently following all life style modifications recommended including significant weight loss of 22 lbs so far.   Will decrease his Lisinopril from 20mg  to 10mg  daily and decrease Protonix frequency from twice daily to once daily.  Follow up with HTN clinic in 1 months to consider Lisinopril discontinuation if possible.  Raquel  Rodriguez-Guzman PharmD, [place] [id number] 08/18/2016 7:12 AM

## 2016-08-23 NOTE — Progress Notes (Signed)
Patient ID: Shannon Ritter                 DOB: [birth year]                      MRN: [id number]     HPI: Shannon Ritter is a 46 y.o. male patient of Dr Gwenlyn Found, with a PMH relevant for hypertension and hyperlipidemia.  He reports a history of hypertension going back about 10-15 years, although he admits to not paying attention to readings for many of those years.  When he saw Dr. Gwenlyn Found his pressure was 152/98.   He is currently following up with a nutritionist and report a weight loss of 36 pound over 3 months.  Continues to exercise with stationary bike 2-3 times a week for 40-45 minutes and started to run 1-2x times per week. Alcohol intake also significantly decreased from 3-4 beers a day to only few drink occasionally (twice in the last 2 months).  Presents to clinic today for HTN follow up . Denies headaches, fatigue or swelling. Patient dislike taking medication and will like to decrease amount of daily medication he takes.  Blood Pressure Goal: 130/80  Current Medications: Lisinopril 10 mg daily  Family History: Mother has hypertension and diabetes. Father had double lung transplant around age 79, lived for 15 years before dying of cancer.  Social Hx: Previously was drinking 3-4 beers per day. Since seeing MD has cut back to only 2-3 per month.  Diet: Has improved greatly since seeing MD. Dinner mostly at home with his family, has switched to salmon and [place], cut out fried foods for the most part. Previously lunch was fast food, now choosing [employer] healthy grilled meats or other non-fried meats from other fast food.   Exercise: Stationary bike for ~ 45 minutes most days, running 2-3x per week and start playing softball at [facility] BP readings:  18 readings; 114/80 average (range 101-125/71-90); pulse 66-87  Wt Readings from Last 3 Encounters:  04/30/16 208 lb (94.3 kg)   BP Readings from Last 3 Encounters:  08/24/16 118/78  07/21/16 (!) 130/92  04/30/16 (!) 152/98    Pulse Readings from Last 3 Encounters:  08/24/16 73  07/21/16 77  04/30/16 81    Current Outpatient Prescriptions on File Prior to Visit  Medication Sig Dispense Refill  . ALPRAZolam (XANAX) 1 MG tablet Take 1 mg by mouth at bedtime.    Marland Kitchen aspirin 81 MG tablet Take 81 mg by mouth daily.    Marland Kitchen atorvastatin (LIPITOR) 40 MG tablet Take 40 mg by mouth daily.    . Blood Pressure Monitoring (BLOOD PRESSURE MONITOR/ARM) DEVI 1 Device by Does not apply route once. 1 Device 0  . lisinopril (PRINIVIL,ZESTRIL) 20 MG tablet Take 10 mg by mouth daily.    . Multiple Vitamins-Minerals (MULTIVITAMIN ADULT PO) Take 1 tablet by mouth daily.    . pantoprazole (PROTONIX) 40 MG tablet Take 40 mg by mouth daily.     No current facility-administered medications on file prior to visit.     Blood pressure 118/78, pulse 73, SpO2 98 %.  Essential hypertension:  Blood pressure well controlled and at goal of <130/80. Home readings average also controlled at 144/80.  Mr Kussman implemented significant lifestyle modifications like: decreases intake of fried foods and take-out, weight loss of 35 lbs, more physical activity, and alcohol intake significantly decreased as well.  Patient insistent on discontinuation of BP medication.  Will hold  Lisinopril for now and continue close monitoring.  Patient received counseling about possible need to resume Lisinopril 10mg  daily if BP start to trend upward.  Will follow up with clinic in 3 weeks.  Patient fasting today and will stop to get lipid panel follow up as previously instructed by Dr Gwenlyn Found.  Ekam Bonebrake Rodriguez-Guzman PharmD, [place] [id number] 08/24/2016 9:19 AM

## 2016-08-24 ENCOUNTER — Ambulatory Visit (INDEPENDENT_AMBULATORY_CARE_PROVIDER_SITE_OTHER): Payer: Managed Care, Other (non HMO) | Admitting: Pharmacist

## 2016-08-24 ENCOUNTER — Ambulatory Visit: Payer: Managed Care, Other (non HMO)

## 2016-08-24 VITALS — BP 118/78 | HR 73 | Wt 171.8 lb

## 2016-08-24 DIAGNOSIS — I10 Essential (primary) hypertension: Secondary | ICD-10-CM | POA: Diagnosis not present

## 2016-08-24 NOTE — Patient Instructions (Addendum)
Return for a a follow up appointment in in 3 weeks  Your blood pressure today is 118/78 pulse 73  (wt 171.8 pounds)  Check your blood pressure at home daily (if able) and keep record of the readings.  Take your BP meds as follows: **NONE**  HOLD lisinopril for now  Bring all of your meds, your BP cuff and your record of home blood pressures to your next appointment.  Exercise as you're able, try to walk approximately 30 minutes per day.  Keep salt intake to a minimum, especially watch canned and prepared boxed foods.  Eat more fresh fruits and vegetables and fewer canned items.  Avoid eating in fast food restaurants.    HOW TO TAKE YOUR BLOOD PRESSURE: . Rest 5 minutes before taking your blood pressure. .  Don't smoke or drink caffeinated beverages for at least 30 minutes before. . Take your blood pressure before (not after) you eat. . Sit comfortably with your back supported and both feet on the floor (don't cross your legs). . Elevate your arm to heart level on a table or a desk. . Use the proper sized cuff. It should fit smoothly and snugly around your bare upper arm. There should be enough room to slip a fingertip under the cuff. The bottom edge of the cuff should be 1 inch above the crease of the elbow. . Ideally, take 3 measurements at one sitting and record the average.

## 2016-09-01 ENCOUNTER — Telehealth: Payer: Self-pay | Admitting: Cardiovascular Disease

## 2016-09-01 NOTE — Telephone Encounter (Signed)
Returned the call to the patient. He stated that he was calling for his lab results. He was informed that it has not been read yet and someone will call as soon as they have been read. He verbalized his understanding.

## 2016-09-01 NOTE — Telephone Encounter (Signed)
New message    Pt is calling about blood work results. He said he had blood work done a couple weeks ago.

## 2016-09-15 ENCOUNTER — Ambulatory Visit (INDEPENDENT_AMBULATORY_CARE_PROVIDER_SITE_OTHER): Payer: Managed Care, Other (non HMO) | Admitting: Pharmacist Clinician (PhC)/ Clinical Pharmacy Specialist

## 2016-09-15 ENCOUNTER — Encounter: Payer: Self-pay | Admitting: Pharmacist Clinician (PhC)/ Clinical Pharmacy Specialist

## 2016-09-15 DIAGNOSIS — I10 Essential (primary) hypertension: Secondary | ICD-10-CM

## 2016-09-15 DIAGNOSIS — E7800 Pure hypercholesterolemia, unspecified: Secondary | ICD-10-CM | POA: Diagnosis not present

## 2016-09-15 NOTE — Patient Instructions (Signed)
Call if you notice that your diastolic blood pressure (bottom number) is consistently in the 80's.  We would like to see it drop into the 70's.  Shannon Ritter/Raquel [phone].  Your blood pressure today is 132/92  Check your blood pressure at home 4-5 days per week and keep record of the readings.  Take your BP meds as follows:  If you see diastolic readings > 90 2 days in a row please  10 mg of lisinopril  Bring all of your meds, your BP cuff and your record of home blood pressures to your next appointment.  Exercise as you're able, try to walk approximately 30 minutes per day.  Keep salt intake to a minimum, especially watch canned and prepared boxed foods.  Eat more fresh fruits and vegetables and fewer canned items.  Avoid eating in fast food restaurants.    HOW TO TAKE YOUR BLOOD PRESSURE: . Rest 5 minutes before taking your blood pressure. .  Don't smoke or drink caffeinated beverages for at least 30 minutes before. . Take your blood pressure before (not after) you eat. . Sit comfortably with your back supported and both feet on the floor (don't cross your legs). . Elevate your arm to heart level on a table or a desk. . Use the proper sized cuff. It should fit smoothly and snugly around your bare upper arm. There should be enough room to slip a fingertip under the cuff. The bottom edge of the cuff should be 1 inch above the crease of the elbow. . Ideally, take 3 measurements at one sitting and record the average.

## 2016-09-15 NOTE — Progress Notes (Signed)
Patient ID: Shannon Ritter                 DOB: 04/27/1971                      MRN: [id number]     HPI: ORA MCNATT is a 46 y.o. male patient of Dr Gwenlyn Found, with a PMH relevant for hypertension and hyperlipidemia.  He reports a history of hypertension going back about 10-15 years, although he admits to not paying attention to readings for many of those years.  He returns today for hypertension follow up.    At his last visit he had significantly decreased his alcohol consumption and had excellent BP readings.  Because of this, it was decided to discontinue his lisinopril altogether.  Today he admits, that he has realized a pattern of elevated blood pressure for 2-3 days after a night of drinking, then returning to his baseline, which appears to be in the 110-120/80-90 range.  He states he is trying to further decrease his alcohol consumption as he does not like to take medications.   His weight is holding steady, currently at 172 lbs today.  He recently had cholesterol labs drawn, LDL much improved at 76 on atorvastatin 40 mg daily.  Patient is primary prevention, with LDL goal of < 100   Blood Pressure Goal: 130/80  Current Medications: None  Current Lipid medications: Atorvastatin 40 mg  Family History: Mother has hypertension and diabetes. Father had double lung transplant around age 23, lived for 15 years before dying of cancer.  Social Hx: Previously was drinking 3-4 beers per day. Since seeing MD has cut back to only 2-3 times per month.  Diet: Has worked hard on his diet to help with weight loss - Dinner mostly at home with his family, has switched to salmon and [place], cut out fried foods. Previously lunch was fast food, now choosing [employer] healthy grilled meats or other non-fried meats from other fast food.   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    Home BP  readings:  11 readings; 121/85 average (range 114-135/79-92); pulse 66-87   Wt Readings from Last 3 Encounters:  09/15/16 172 lb (78 kg)  08/24/16 171 lb 12.8 oz (77.9 kg)  04/30/16 208 lb (94.3 kg)   BP Readings from Last 3 Encounters:  09/15/16 (!) 132/92  08/24/16 118/78  07/21/16 (!) 130/92   Pulse Readings from Last 3 Encounters:  09/15/16 66  08/24/16 73  07/21/16 77    Current Outpatient Prescriptions on File Prior to Visit  Medication Sig Dispense Refill  . ALPRAZolam (XANAX) 1 MG tablet Take 1 mg by mouth at bedtime.    Marland Kitchen aspirin 81 MG tablet Take 81 mg by mouth daily.    Marland Kitchen atorvastatin (LIPITOR) 40 MG tablet Take 20 mg by mouth daily.     . Blood Pressure Monitoring (BLOOD PRESSURE MONITOR/ARM) DEVI 1 Device by Does not apply route once. 1 Device 0  . lisinopril (PRINIVIL,ZESTRIL) 20 MG tablet Take 10 mg by mouth daily.    . Multiple Vitamins-Minerals (MULTIVITAMIN ADULT PO) Take 1 tablet by mouth daily.    . pantoprazole (PROTONIX) 40 MG tablet Take 40 mg by mouth daily.     No current facility-administered medications on file prior to visit.     Blood pressure (!) 132/92, pulse  66, height 5\' 11"  (1.803 m), weight 172 lb (78 kg).  Essential hypertension:  Although systolic BP well at goal, had a long discussion about the long term concerns with elevated diastolic readings.  He will continue to watch his BP daily, and knows to call the office if his diastolic readings over the next 3-4 weeks average 85 or above.  Explained that he may need to take low dose lisinopril to keep BP at goal.    Hyperlipidemia: Most recent LDL at 76 on atorvastatin 40 mg daily.  Patient is wishing to d/c medication as LDL below goal of 100.  Advised that cholesterol will rise with d/c of statin, suggested that he cut dose back to 20 mg daily and we can repeat labs in 3 months.  Patient agreeable to plan.    Tommy Medal PharmD, CPP, [facility] [place] [id number] 09/15/2016 7:37 PM

## 2016-09-15 NOTE — Assessment & Plan Note (Signed)
Most recent LDL at 76 on atorvastatin 40 mg daily.  Patient is wishing to d/c medication as LDL below goal of 100.  Advised that cholesterol will rise with d/c of statin, suggested that he cut dose back to 20 mg daily and we can repeat labs in 3 months.  Patient agreeable to plan.

## 2016-09-15 NOTE — Assessment & Plan Note (Signed)
Although systolic BP well at goal, had a long discussion about the long term concerns with elevated diastolic readings.  He will continue to watch his BP daily, and knows to call the office if his diastolic readings over the next 3-4 weeks average 85 or above.  Explained that he may need to take low dose lisinopril to keep BP at goal.

## 2020-02-03 NOTE — Progress Notes (Deleted)
Cardiology Office Note:    Date:  02/03/2020   ID:  Shannon Ritter, DOB 1970/07/25, MRN [id number]  PCP:  Redmond School, MD  [facility] Cardiologist:  No primary care provider on file.  [facility] Electrophysiologist:  None   Referring MD: Redmond School, MD   No chief complaint on file. ***  History of Present Illness:    Shannon Ritter is a 49 y.o. male with a hx of HTN, HLD, tobacco use and GERD who was referred by Dr. Gerarda Fraction for evaluation of his chest pain.  Of note, he was last seen by Dr. Gwenlyn Found in 2017 with chest pain. Stress TTE 11/11/2015 was normal without ischemic changes. Per report, had a cath approximately 14 years ago with Dr. Einar Gip which was reportedly normal. Recent coronary calcium score 12 (LAD-12)  Today ***    No past medical history on file.  No past surgical history on file.  Current Medications: No outpatient medications have been marked as taking for the 02/07/20 encounter (Appointment) with Freada Bergeron, MD.     Allergies:   Patient has no known allergies.   Social History   Socioeconomic History   Marital status: Legally Separated    Spouse name: Not on file   Number of children: Not on file   Years of education: Not on file   Highest education level: Not on file  Occupational History   Not on file  Tobacco Use   Smoking status: Current Some Day Smoker   Smokeless tobacco: Never Used  Substance and Sexual Activity   Alcohol use: Yes   Drug use: Not on file   Sexual activity: Not on file  Other Topics Concern   Not on file  Social History Narrative   Not on file   Social Determinants of Health   Financial Resource Strain:    Difficulty of Paying Living Expenses: Not on file  Food Insecurity:    Worried About [place] in the Last Year: Not on file   Ran Out of Food in the Last Year: Not on file  Transportation Needs:    Lack of Transportation (Medical): Not on file   Lack of Transportation  (Non-Medical): Not on file  Physical Activity:    Days of Exercise per Week: Not on file   Minutes of Exercise per Session: Not on file  Stress:    Feeling of Stress : Not on file  Social Connections:    Frequency of Communication with Friends and Family: Not on file   Frequency of Social Gatherings with Friends and Family: Not on file   Attends Religious Services: Not on file   Active Member of Clubs or Organizations: Not on file   Attends Archivist Meetings: Not on file   Marital Status: Not on file     Family History: The patient's ***family history is not on file.  ROS:   Please see the history of present illness.    *** All other systems reviewed and are negative.  EKGs/Labs/Other Studies Reviewed:    The following studies were reviewed today:  Coronary calcium score 01/22/2020: TOTAL CALCIUM SCORE :  12  LEFT MAIN:0  LAD:12  CIRCUMFLEX: 0  RCA: 0     ECG 04/30/2016: NSR. No ischemic changes.   Stress echo 06/27/217:  Summary  Functional capacity is excellent for age/sex - 11 mets on the 2-min Bruce  protocol  Normal resting biventricular function (ejection fraction), with no resting  segmental  abnormality.  Pt has chest tightness prior to exercise, Chest tightness remains the same  during exercise, No EKG changes.  Negative stress echocardiogram     EKG:  EKG is *** ordered today.  The ekg ordered today demonstrates ***  Recent Labs: No results found for requested labs within last 8760 hours.  Recent Lipid Panel No results found for: CHOL, TRIG, HDL, CHOLHDL, VLDL, LDLCALC, LDLDIRECT  Physical Exam:    VS:  There were no vitals taken for this visit.    Wt Readings from Last 3 Encounters:  09/15/16 172 lb (78 kg)  08/24/16 171 lb 12.8 oz (77.9 kg)  04/30/16 208 lb (94.3 kg)     GEN: *** Well nourished, well developed in no acute distress HEENT: Normal NECK: No JVD; No carotid bruits LYMPHATICS: No lymphadenopathy CARDIAC:  ***RRR, no murmurs, rubs, gallops RESPIRATORY:  Clear to auscultation without rales, wheezing or rhonchi  ABDOMEN: Soft, non-tender, non-distended MUSCULOSKELETAL:  No edema; No deformity  SKIN: Warm and dry NEUROLOGIC:  Alert and oriented x 3 PSYCHIATRIC:  Normal affect   ASSESSMENT:    No diagnosis found. PLAN:    In order of problems listed above:  1. Chest Pain  2. Hypertension  3. Hyperlipidemia   Medication Adjustments/Labs and Tests Ordered: Current medicines are reviewed at length with the patient today.  Concerns regarding medicines are outlined above.  No orders of the defined types were placed in this encounter.  No orders of the defined types were placed in this encounter.   There are no Patient Instructions on file for this visit.   Signed, Freada Bergeron, MD  02/03/2020 3:35 PM    [facility]

## 2020-02-07 ENCOUNTER — Ambulatory Visit: Payer: Managed Care, Other (non HMO) | Admitting: Cardiology

## 2020-02-18 NOTE — Progress Notes (Deleted)
Cardiology Office Note:    Date:  02/18/2020   ID:  Shannon Ritter, DOB [birth year], MRN [id number]  PCP:  Redmond School, MD  [place] Cardiologist:  No primary care provider on file.  [facility] Electrophysiologist:  None   Referring MD: Redmond School, MD    History of Present Illness:    Shannon Ritter is a 49 y.o. male with a hx of HTN and HLD who was referred by Dr. Gerarda Fraction for chest pain.  Last saw Dr. Gwenlyn Found in 04/2016 where he presented with atypical chest pain. Stress TTE at that time showed excellent exercise capacity with no ischemia.  Coronary calcium score 01/22/20: 12 (LAD only)  No past medical history on file.  No past surgical history on file.  Current Medications: No outpatient medications have been marked as taking for the 02/20/20 encounter (Appointment) with Freada Bergeron, MD.     Allergies:   Patient has no known allergies.   Social History   Socioeconomic History  . Marital status: Legally Separated    Spouse name: Not on file  . Number of children: Not on file  . Years of education: Not on file  . Highest education level: Not on file  Occupational History  . Not on file  Tobacco Use  . Smoking status: Current Some Day Smoker  . Smokeless tobacco: Never Used  Substance and Sexual Activity  . Alcohol use: Yes  . Drug use: Not on file  . Sexual activity: Not on file  Other Topics Concern  . Not on file  Social History Narrative  . Not on file   Social Determinants of Health   Financial Resource Strain:   . Difficulty of Paying Living Expenses: Not on file  Food Insecurity:   . Worried About Charity fundraiser in the Last Year: Not on file  . Ran Out of Food in the Last Year: Not on file  Transportation Needs:   . Lack of Transportation (Medical): Not on file  . Lack of Transportation (Non-Medical): Not on file  Physical Activity:   . Days of Exercise per Week: Not on file  . Minutes of Exercise per Session: Not on file    Stress:   . Feeling of Stress : Not on file  Social Connections:   . Frequency of Communication with Friends and Family: Not on file  . Frequency of Social Gatherings with Friends and Family: Not on file  . Attends Religious Services: Not on file  . Active Member of Clubs or Organizations: Not on file  . Attends Archivist Meetings: Not on file  . Marital Status: Not on file     Family History: The patient's ***family history is not on file.  ROS:   Please see the history of present illness.    *** All other systems reviewed and are negative.  EKGs/Labs/Other Studies Reviewed:    The following studies were reviewed today: Coronary calcium 09/21: FINDINGS:  TOTAL CALCIUM SCORE :  12  LEFT MAIN:0  LAD:12  CIRCUMFLEX: 0  RCA: 0  CARDIAC ANATOMY:Normal.  VISUALIZED THORAX:Unremarkable  Exercise TTE 2017: Summary  Functional capacity is excellent for age/sex - 11 mets on the 2-min Bruce  protocol  Normal resting biventricular function (ejection fraction), with no resting  segmental abnormality.  Pt has chest tightness prior to exercise, Chest tightness remains the same  during exercise, No EKG changes.  Negative stress echocardiogram    EKG:  EKG is *** ordered  today.  The ekg ordered today demonstrates ***  Recent Labs: No results found for requested labs within last 8760 hours.  Recent Lipid Panel No results found for: CHOL, TRIG, HDL, CHOLHDL, VLDL, LDLCALC, LDLDIRECT  Physical Exam:    VS:  There were no vitals taken for this visit.    Wt Readings from Last 3 Encounters:  09/15/16 172 lb (78 kg)  08/24/16 171 lb 12.8 oz (77.9 kg)  04/30/16 208 lb (94.3 kg)     GEN: *** Well nourished, well developed in no acute distress HEENT: Normal NECK: No JVD; No carotid bruits LYMPHATICS: No lymphadenopathy CARDIAC: ***RRR, no murmurs, rubs, gallops RESPIRATORY:  Clear to auscultation without rales, wheezing or rhonchi  ABDOMEN: Soft, non-tender,  non-distended MUSCULOSKELETAL:  No edema; No deformity  SKIN: Warm and dry NEUROLOGIC:  Alert and oriented x 3 PSYCHIATRIC:  Normal affect   ASSESSMENT:    No diagnosis found. PLAN:    In order of problems listed above:  1. ***   Medication Adjustments/Labs and Tests Ordered: Current medicines are reviewed at length with the patient today.  Concerns regarding medicines are outlined above.  No orders of the defined types were placed in this encounter.  No orders of the defined types were placed in this encounter.   There are no Patient Instructions on file for this visit.   Signed, Freada Bergeron, MD  02/18/2020 7:17 PM    [facility]

## 2020-02-20 ENCOUNTER — Ambulatory Visit: Payer: Managed Care, Other (non HMO) | Admitting: Cardiology

## 2020-03-18 NOTE — Progress Notes (Deleted)
Cardiology Office Note:    Date:  03/18/2020   ID:  Shannon Ritter, DOB Jul 28, 1970, MRN [id number]  PCP:  Redmond School, MD  [place] Cardiologist:  No primary care provider on file.  [facility] Electrophysiologist:  None   Referring MD: Redmond School, MD    History of Present Illness:    Shannon Ritter is a 49 y.o. male with a hx of HTN and HLD who was referred by Dr. Gerarda Fraction for further evaluation of chest pain.  Stress echo 11/11/15: Normal  No past medical history on file.  No past surgical history on file.  Current Medications: No outpatient medications have been marked as taking for the 03/20/20 encounter (Appointment) with Freada Bergeron, MD.     Allergies:   Patient has no known allergies.   Social History   Socioeconomic History  . Marital status: Legally Separated    Spouse name: Not on file  . Number of children: Not on file  . Years of education: Not on file  . Highest education level: Not on file  Occupational History  . Not on file  Tobacco Use  . Smoking status: Current Some Day Smoker  . Smokeless tobacco: Never Used  Substance and Sexual Activity  . Alcohol use: Yes  . Drug use: Not on file  . Sexual activity: Not on file  Other Topics Concern  . Not on file  Social History Narrative  . Not on file   Social Determinants of Health   Financial Resource Strain:   . Difficulty of Paying Living Expenses: Not on file  Food Insecurity:   . Worried About Charity fundraiser in the Last Year: Not on file  . Ran Out of Food in the Last Year: Not on file  Transportation Needs:   . Lack of Transportation (Medical): Not on file  . Lack of Transportation (Non-Medical): Not on file  Physical Activity:   . Days of Exercise per Week: Not on file  . Minutes of Exercise per Session: Not on file  Stress:   . Feeling of Stress : Not on file  Social Connections:   . Frequency of Communication with Friends and Family: Not on file  . Frequency of  Social Gatherings with Friends and Family: Not on file  . Attends Religious Services: Not on file  . Active Member of Clubs or Organizations: Not on file  . Attends Archivist Meetings: Not on file  . Marital Status: Not on file     Family History: The patient's ***family history is not on file.  ROS:   Please see the history of present illness.    *** All other systems reviewed and are negative.  EKGs/Labs/Other Studies Reviewed:    The following studies were reviewed today: ***  EKG:  EKG is *** ordered today.  The ekg ordered today demonstrates ***  Recent Labs: No results found for requested labs within last 8760 hours.  Recent Lipid Panel No results found for: CHOL, TRIG, HDL, CHOLHDL, VLDL, LDLCALC, LDLDIRECT   Risk Assessment/Calculations:   {Does this patient have ATRIAL FIBRILLATION?:[phone]}   Physical Exam:    VS:  There were no vitals taken for this visit.    Wt Readings from Last 3 Encounters:  09/15/16 172 lb (78 kg)  08/24/16 171 lb 12.8 oz (77.9 kg)  04/30/16 208 lb (94.3 kg)     GEN: *** Well nourished, well developed in no acute distress HEENT: Normal NECK: No JVD;  No carotid bruits LYMPHATICS: No lymphadenopathy CARDIAC: ***RRR, no murmurs, rubs, gallops RESPIRATORY:  Clear to auscultation without rales, wheezing or rhonchi  ABDOMEN: Soft, non-tender, non-distended MUSCULOSKELETAL:  No edema; No deformity  SKIN: Warm and dry NEUROLOGIC:  Alert and oriented x 3 PSYCHIATRIC:  Normal affect   ASSESSMENT:    No diagnosis found. PLAN:    In order of problems listed above:  #HTN:  #HLD:   Medication Adjustments/Labs and Tests Ordered: Current medicines are reviewed at length with the patient today.  Concerns regarding medicines are outlined above.  No orders of the defined types were placed in this encounter.  No orders of the defined types were placed in this encounter.   There are no Patient Instructions on file for  this visit.   Signed, Freada Bergeron, MD  03/18/2020 7:33 PM    [place]

## 2020-03-20 ENCOUNTER — Ambulatory Visit: Payer: Managed Care, Other (non HMO) | Admitting: Cardiology

## 2020-04-02 ENCOUNTER — Encounter: Payer: Self-pay | Admitting: General Practice

## 2020-07-16 ENCOUNTER — Other Ambulatory Visit: Payer: Self-pay

## 2020-07-16 ENCOUNTER — Emergency Department (HOSPITAL_COMMUNITY)
Admission: EM | Admit: 2020-07-16 | Discharge: 2020-07-16 | Disposition: A | Discharge status: Home / Self Care | Payer: Managed Care, Other (non HMO) | Attending: Emergency Medicine | Admitting: Emergency Medicine

## 2020-07-16 ENCOUNTER — Encounter (HOSPITAL_COMMUNITY): Payer: Self-pay

## 2020-07-16 ENCOUNTER — Emergency Department (HOSPITAL_COMMUNITY): Payer: Managed Care, Other (non HMO)

## 2020-07-16 DIAGNOSIS — Z7982 Long term (current) use of aspirin: Secondary | ICD-10-CM | POA: Insufficient documentation

## 2020-07-16 DIAGNOSIS — R000 Tachycardia, unspecified: Secondary | ICD-10-CM | POA: Diagnosis not present

## 2020-07-16 DIAGNOSIS — I10 Essential (primary) hypertension: Secondary | ICD-10-CM | POA: Diagnosis not present

## 2020-07-16 DIAGNOSIS — K610 Anal abscess: Secondary | ICD-10-CM | POA: Diagnosis not present

## 2020-07-16 DIAGNOSIS — F17200 Nicotine dependence, unspecified, uncomplicated: Secondary | ICD-10-CM | POA: Diagnosis not present

## 2020-07-16 DIAGNOSIS — Z79899 Other long term (current) drug therapy: Secondary | ICD-10-CM | POA: Insufficient documentation

## 2020-07-16 DIAGNOSIS — R509 Fever, unspecified: Secondary | ICD-10-CM | POA: Diagnosis present

## 2020-07-16 HISTORY — DX: Essential (primary) hypertension: I10

## 2020-07-16 LAB — COMPREHENSIVE METABOLIC PANEL
ALT: 27 U/L (ref 0–44)
AST: 17 U/L (ref 15–41)
Albumin: 4.2 g/dL (ref 3.5–5.0)
Alkaline Phosphatase: 64 U/L (ref 38–126)
Anion gap: 12 (ref 5–15)
BUN: 17 mg/dL (ref 6–20)
CO2: 21 mmol/L — ABNORMAL LOW (ref 22–32)
Calcium: 9.1 mg/dL (ref 8.9–10.3)
Chloride: 98 mmol/L (ref 98–111)
Creatinine, Ser: 0.93 mg/dL (ref 0.61–1.24)
GFR, Estimated: >60 mL/min (ref >60)
Glucose, Bld: 131 mg/dL — ABNORMAL HIGH (ref 70–99)
Potassium: 3.5 mmol/L (ref 3.5–5.1)
Sodium: 131 mmol/L — ABNORMAL LOW (ref 135–145)
Total Bilirubin: 1.2 mg/dL (ref 0.3–1.2)
Total Protein: 8.4 g/dL — ABNORMAL HIGH (ref 6.5–8.1)

## 2020-07-16 LAB — CBC WITH DIFFERENTIAL/PLATELET
Abs Immature Granulocytes: 0.04 10*3/uL (ref 0.00–0.07)
Basophils Absolute: 0.1 10*3/uL (ref 0.0–0.1)
Basophils Relative: 1 %
Eosinophils Absolute: 0 10*3/uL (ref 0.0–0.5)
Eosinophils Relative: 0 %
HCT: 42.6 % (ref 39.0–52.0)
Hemoglobin: 14.2 g/dL (ref 13.0–17.0)
Immature Granulocytes: 0 %
Lymphocytes Relative: 6 %
Lymphs Abs: 0.8 10*3/uL (ref 0.7–4.0)
MCH: 30.1 pg (ref 26.0–34.0)
MCHC: 33.3 g/dL (ref 30.0–36.0)
MCV: 90.3 fL (ref 80.0–100.0)
Monocytes Absolute: 0.9 10*3/uL (ref 0.1–1.0)
Monocytes Relative: 7 %
Neutro Abs: 10.6 10*3/uL — ABNORMAL HIGH (ref 1.7–7.7)
Neutrophils Relative %: 86 %
Platelets: 274 10*3/uL (ref 150–400)
RBC: 4.72 MIL/uL (ref 4.22–5.81)
RDW: 12.6 % (ref 11.5–15.5)
WBC: 12.4 10*3/uL — ABNORMAL HIGH (ref 4.0–10.5)
nRBC: 0 % (ref 0.0–0.2)

## 2020-07-16 LAB — URINALYSIS, ROUTINE W REFLEX MICROSCOPIC
Bacteria, UA: NONE SEEN
Bilirubin Urine: NEGATIVE
Glucose, UA: NEGATIVE mg/dL
Ketones, ur: NEGATIVE mg/dL
Leukocytes,Ua: NEGATIVE
Nitrite: NEGATIVE
Protein, ur: NEGATIVE mg/dL
Specific Gravity, Urine: 1.017 (ref 1.005–1.030)
pH: 7 (ref 5.0–8.0)

## 2020-07-16 LAB — CULTURE, BLOOD (ROUTINE X 2): Special Requests: ADEQUATE

## 2020-07-16 LAB — LACTIC ACID, PLASMA
Lactic Acid, Venous: 0.9 mmol/L (ref 0.5–1.9)
Lactic Acid, Venous: 1.1 mmol/L (ref 0.5–1.9)

## 2020-07-16 MED ORDER — SODIUM CHLORIDE 0.9 % IV BOLUS
1000.0000 mL | Freq: Once | INTRAVENOUS | Status: AC
  Administered 2020-07-16: 1000 mL via INTRAVENOUS

## 2020-07-16 MED ORDER — METRONIDAZOLE IN NACL 5-0.79 MG/ML-% IV SOLN
500.0000 mg | Freq: Once | INTRAVENOUS | Status: AC
  Administered 2020-07-16: 500 mg via INTRAVENOUS
  Filled 2020-07-16: qty 100

## 2020-07-16 MED ORDER — CIPROFLOXACIN IN D5W 400 MG/200ML IV SOLN
400.0000 mg | Freq: Once | INTRAVENOUS | Status: AC
  Administered 2020-07-16: 400 mg via INTRAVENOUS
  Filled 2020-07-16: qty 200

## 2020-07-16 MED ORDER — LIDOCAINE HCL (PF) 1 % IJ SOLN
5.0000 mL | Freq: Once | INTRAMUSCULAR | Status: AC
  Administered 2020-07-16: 5 mL
  Filled 2020-07-16: qty 30

## 2020-07-16 MED ORDER — MORPHINE SULFATE (PF) 4 MG/ML IV SOLN
4.0000 mg | Freq: Once | INTRAVENOUS | Status: AC
  Administered 2020-07-16: 4 mg via INTRAVENOUS
  Filled 2020-07-16: qty 1

## 2020-07-16 MED ORDER — OXYCODONE-ACETAMINOPHEN 5-325 MG PO TABS: 1.0000 | ORAL_TABLET | Freq: Four times a day (QID) | ORAL | 0 refills | Status: DC | PRN

## 2020-07-16 MED ORDER — IOHEXOL 300 MG/ML  SOLN
100.0000 mL | Freq: Once | INTRAMUSCULAR | Status: AC | PRN
  Administered 2020-07-16: 100 mL via INTRAVENOUS

## 2020-07-16 MED ORDER — OXYCODONE-ACETAMINOPHEN 5-325 MG PO TABS
1.0000 | ORAL_TABLET | Freq: Once | ORAL | Status: AC
  Administered 2020-07-16: 1 via ORAL
  Filled 2020-07-16: qty 1

## 2020-07-16 MED ORDER — ACETAMINOPHEN 325 MG PO TABS
650.0000 mg | ORAL_TABLET | Freq: Once | ORAL | Status: AC
  Administered 2020-07-16: 650 mg via ORAL
  Filled 2020-07-16: qty 2

## 2020-07-16 NOTE — ED Provider Notes (Signed)
[facility] EMERGENCY DEPARTMENT Provider Note   CSN: [id number] Arrival date & time: 07/16/20  1500     History Chief Complaint  Patient presents with  . Fever    Shannon Ritter is a 50 y.o. male with PMHx HTN who presents to the ED today with complaint of fever and buttock pain x 4 days. Pt reports he first noticed a small area of irritation to his right buttock next to his anus 4 days ago that has since grown in size. He has also had fevers with tmax 102.0 at home (102.6 in the ED today). He called his PCP yesterday however they did not want to see him in the office given he was febrile; he spoke with his PCP on the phone who prescribed him Bactrim for his abscess. Pt saw PCP today for further evaluation who advised he come to the ED. ED physician was called earlier notifying that pt would be coming to the ED with concern for cellulitis/perianal abscess with failed oral antibiotic use. Pt has only had a total of 3 doses of Bactrim. Pt reports that he has not had a bowel movement in a couple of days due to having a lack of appetite; he is still passing gas. He has never had a perianal/perirectal abscess in the past. He denies history of diabetes. Denies abdominal pain, nausea, vomiting, urinary symptoms, testicular pain/swelling, or any other associated symptoms.   The history is provided by the patient and medical records.       Past Medical History:  Diagnosis Date  . Hypertension     Patient Active Problem List   Diagnosis Date Noted  . Essential hypertension 04/30/2016  . Hyperlipidemia 04/30/2016  . Chest pain 04/30/2016    History reviewed. No pertinent surgical history.     History reviewed. No pertinent family history.  Social History   Tobacco Use  . Smoking status: Current Some Day Smoker  . Smokeless tobacco: Never Used  Vaping Use  . Vaping Use: Never used  Substance Use Topics  . Alcohol use: Yes  . Drug use: Never    Home Medications Prior to Admission  medications   Medication Sig Start Date End Date Taking? Authorizing Provider  oxyCODONE-acetaminophen (PERCOCET/ROXICET) 5-325 MG tablet Take 1 tablet by mouth every 6 (six) hours as needed for severe pain. 07/16/20  Yes Yishai Rehfeld, PA-C  ALPRAZolam Duanne Moron) 1 MG tablet Take 1 mg by mouth at bedtime. 04/10/16   [provider]  aspirin 81 MG tablet Take 81 mg by mouth daily.    [provider]  atorvastatin (LIPITOR) 40 MG tablet Take 20 mg by mouth daily.     [provider]  Blood Pressure Monitoring (BLOOD PRESSURE MONITOR/ARM) DEVI 1 Device by Does not apply route once. 05/03/16 05/03/16  Lorretta Harp, MD  lisinopril (PRINIVIL,ZESTRIL) 20 MG tablet Take 10 mg by mouth daily. 11/11/15   [provider]  Multiple Vitamins-Minerals (MULTIVITAMIN ADULT PO) Take 1 tablet by mouth daily.    [provider]  pantoprazole (PROTONIX) 40 MG tablet Take 40 mg by mouth daily. 04/05/16   [provider]    Allergies    Patient has no known allergies.  Review of Systems   Review of Systems  Constitutional: Positive for chills and fever.  Gastrointestinal: Positive for rectal pain. Negative for abdominal pain, nausea and vomiting.  All other systems reviewed and are negative.   Physical Exam Updated Vital Signs BP (!) 173/118  Pulse (!) 133   Temp (!) 102.6 F (39.2 C) (Oral)   Resp 18   Ht 5\' 10"  (1.778 m)   Wt 88.9 kg   SpO2 98%   BMI 28.12 kg/m   Physical Exam Vitals and nursing note reviewed.  Constitutional:      Appearance: He is obese. He is not ill-appearing or diaphoretic.  HENT:     Head: Normocephalic and atraumatic.  Eyes:     Conjunctiva/sclera: Conjunctivae normal.  Cardiovascular:     Rate and Rhythm: Regular rhythm. Tachycardia present.  Pulmonary:     Effort: Pulmonary effort is normal.     Breath sounds: Normal breath sounds. No wheezing, rhonchi or rales.  Abdominal:     Palpations: Abdomen is soft.      Tenderness: There is no abdominal tenderness. There is no guarding or rebound.  Genitourinary:    Comments: Chaperone present for GU exam. 2 x 2 cm area of erythema with fluctuance and induration to right medial buttock adjacent to the anus; DRE performed with TTP and swelling to the inner aspect of the rectum as well; no active drainage Musculoskeletal:     Cervical back: Neck supple.  Skin:    General: Skin is warm and dry.  Neurological:     Mental Status: He is alert.     ED Results / Procedures / Treatments   Labs (all labs ordered are listed, but only abnormal results are displayed) Labs Reviewed  COMPREHENSIVE METABOLIC PANEL - Abnormal; Notable for the following components:      Result Value   Sodium 131 (*)    CO2 21 (*)    Glucose, Bld 131 (*)    Total Protein 8.4 (*)    All other components within normal limits  CBC WITH DIFFERENTIAL/PLATELET - Abnormal; Notable for the following components:   WBC 12.4 (*)    Neutro Abs 10.6 (*)    All other components within normal limits  CULTURE, BLOOD (ROUTINE X 2)  CULTURE, BLOOD (ROUTINE X 2)  AEROBIC CULTURE W GRAM STAIN (SUPERFICIAL SPECIMEN)  LACTIC ACID, PLASMA  LACTIC ACID, PLASMA  URINALYSIS, ROUTINE W REFLEX MICROSCOPIC    EKG EKG Interpretation  Date/Time:  Wednesday July 16 2020 16:07:15 EST Ventricular Rate:  123 PR Interval:    QRS Duration: 74 QT Interval:  319 QTC Calculation: 457 R Axis:   33 Text Interpretation: Sinus tachycardia Borderline T abnormalities, diffuse leads Baseline wander in lead(s) II III aVF Since last tracing rate faster Otherwise no significant change Confirmed by Daleen Bo [phone]) on 07/16/2020 4:52:05 PM   Radiology CT PELVIS W CONTRAST  Result Date: 07/16/2020 CLINICAL DATA:  Fever, chills, possible rectal abscess EXAM: CT PELVIS WITH CONTRAST TECHNIQUE: Multidetector CT imaging of the pelvis was performed using the standard protocol following the bolus administration of  intravenous contrast. CONTRAST:  123mL OMNIPAQUE IOHEXOL 300 MG/ML  SOLN COMPARISON:  None. FINDINGS: Urinary Tract: Visualized portions of the kidneys, ureters, and bladder are unremarkable. Bowel: No bowel obstruction or ileus. Minimal sigmoid diverticulosis without diverticulitis. Normal appendix right lower quadrant. No bowel wall thickening or inflammatory change. Vascular/Lymphatic: Borderline enlarged inguinal lymph nodes are seen, measuring up to 12 mm on the right. These are likely reactive. Minimal atherosclerosis of the aorta and iliac vessels. Reproductive:  Prostate is not enlarged. Other: In the medial aspect of the right buttock there is a 4.9 x 3.5 by 3.8 cm abscess and phlegmon, posterior to the anal verge. No free intraperitoneal fluid  or free gas. No abdominal wall hernia. Musculoskeletal: No acute or destructive bony lesion. Reconstructed images demonstrate no additional findings. IMPRESSION: 1. Superficial abscess within the right medial gluteal fold, posterior to the anal verge. 2. Diverticulosis without diverticulitis. 3.  Aortic Atherosclerosis (ICD10-I70.0). Electronically Signed   By: Randa Ngo M.D.   On: 07/16/2020 17:51    Procedures .Marland KitchenIncision and Drainage  Date/Time: 07/16/2020 6:54 PM Performed by: Eustaquio Maize, PA-C Authorized by: Eustaquio Maize, PA-C   Consent:    Consent obtained:  Verbal   Consent given by:  Patient   Risks discussed:  Bleeding, incomplete drainage, pain and damage to other organs   Alternatives discussed:  No treatment Universal protocol:    Procedure explained and questions answered to patient or proxy's satisfaction: yes     Relevant documents present and verified: yes     Test results available : yes     Imaging studies available: yes     Required blood products, implants, devices, and special equipment available: yes     Site/side marked: yes     Immediately prior to procedure, a time out was called: yes     Patient identity  confirmed:  Verbally with patient Location:    Type:  Abscess   Location:  Anogenital   Anogenital location:  Perianal Pre-procedure details:    Skin preparation:  Betadine Anesthesia:    Anesthesia method:  Local infiltration   Local anesthetic:  Lidocaine 1% w/o epi Procedure type:    Complexity:  Complex Procedure details:    Incision types:  Single straight   Incision depth:  Subcutaneous   Scalpel blade:  11   Wound management:  Probed and deloculated, irrigated with saline and extensive cleaning   Drainage:  Purulent   Drainage amount:  Moderate   Packing materials:  1/4 in gauze Post-procedure details:    Procedure completion:  Tolerated well, no immediate complications     Medications Ordered in ED Medications  sodium chloride 0.9 % bolus 1,000 mL (0 mLs Intravenous Stopped 07/16/20 1720)  acetaminophen (TYLENOL) tablet 650 mg (650 mg Oral Given 07/16/20 1610)  morphine 4 MG/ML injection 4 mg (4 mg Intravenous Given 07/16/20 1611)  ciprofloxacin (CIPRO) IVPB 400 mg (0 mg Intravenous Stopped 07/16/20 1720)  metroNIDAZOLE (FLAGYL) IVPB 500 mg (0 mg Intravenous Stopped 07/16/20 1720)  iohexol (OMNIPAQUE) 300 MG/ML solution 100 mL (100 mLs Intravenous Contrast Given 07/16/20 1724)  lidocaine (PF) (XYLOCAINE) 1 % injection 5 mL (5 mLs Infiltration Given 07/16/20 1833)  oxyCODONE-acetaminophen (PERCOCET/ROXICET) 5-325 MG per tablet 1 tablet (1 tablet Oral Given 07/16/20 1833)  sodium chloride 0.9 % bolus 1,000 mL (1,000 mLs Intravenous New Bag/Given 07/16/20 1823)    ED Course  I have reviewed the triage vital signs and the nursing notes.  Pertinent labs & imaging results that were available during my care of the patient were reviewed by me and considered in my medical decision making (see chart for details).  Clinical Course as of 07/16/20 1901  Wed Jul 16, 2020  1646 WBC(!): 12.4 [MV]    Clinical Course User Index [MV] Eustaquio Maize, [place]   MDM Rules/Calculators/A&P                           50 year old male who presents to the ED today for further evaluation of perianal/perirectal abscess for the past 4 days.  Began taking Bactrim yesterday, has had a total of 3 doses however sent by  PCP for failed antibiotics poorly.  On arrival to the ED patient is febrile at 102.6, has not been taking any Tylenol for fevers at home or other fever reducing medications.  He is tachycardic at 133, nontachypneic.  Blood pressure elevated at 173/118, I suspect this is secondary to pain.  On exam patient is overall well-appearing, nontoxic-appearing.  GU exam was performed with chaperone at bedside, patient does have a 2 x 2 centimeter area of erythema, fluctuance as well as induration to the right buttock just adjacent to the anus, does appear to involve the anus at this time.  Patient was sent here by PCP for a CT scan, given the proximity to the anus do agree.  Will provide Tylenol for fever, fluids, work-up for sepsis at this time and obtain CT pelvis with contrast.  Will likely touch base with general surgery for recommendations given proximity.   CBC with leukocytosis 12,400; no left shift CMP with sodium 131; fluids running. Glucose 121 and bicarb 21; no gap.  Lactic acid within normal limits at 0.9  CT scan: IMPRESSION:  1. Superficial abscess within the right medial gluteal fold,  posterior to the anal verge.  2. Diverticulosis without diverticulitis.  3. Aortic Atherosclerosis (ICD10-I70.0).   Discussed case with general surgeon Dr. Constance Haw; recommends I&D in the ED +/- admission. If pt does not require admission then he can follow up outpatient tomorrow afternoon. Agrees with continuing Bactrim.   I&D performed without difficulty. Heart rate decreasing with additional fluids. I do not think pt requires admission at this time given he has only had a total of #3 doses of his oral Bactrim. Will discharge home with outpatient follow up tomorrow. Pt instructed to continue taking abx as  prescribed with Ibuprofen and Tylenol PRN for pain. Will discharge with short course of pain medication to take as needed. Pt instructed on sitz baths and light massage to the area. He is also instructed on Miralax for stool softening given the proximity of the incision to his anus. Pt is in agreement with plan at this time and stable for discharge home.   This note was prepared using Dragon voice recognition software and may include unintentional dictation errors due to the inherent limitations of voice recognition software.  Final Clinical Impression(s) / ED Diagnoses Final diagnoses:  Perianal abscess    Rx / DC Orders ED Discharge Orders         Ordered    oxyCODONE-acetaminophen (PERCOCET/ROXICET) 5-325 MG tablet  Every 6 hours PRN        07/16/20 1858           Discharge Instructions     Please call Dr. Constance Haw office tomorrow morning to be seen in the office sometime tomorrow afternoon for further evaluation of your abscess  Continue taking your antibiotic as prescribed. It is important to take the entire course of antibiotics to cover for infection.   Take Tylenol as needed for fevers > 100.4. Drink plenty of fluids to stay hydrated.   I have prescribed a short course of pain medication that contains Tylenol in it. Do not exceed total daily dose of 4,000 mg of Tylenol. You can also take Ibuprofen as needed for pain.   While at home you can do sitz baths to help with inflammation and lightly massage the area to allow for additional drainage of pus. I would also recommend Miralax daily to help soften the stools.          Eustaquio Maize,  PA-C 07/16/20 1903    Daleen Bo, MD 07/17/20 [phone]

## 2020-07-16 NOTE — ED Triage Notes (Signed)
Pt to er, pt states that over the weekend he started having a fever and some pain in his buttock, states that yesterday he talked to his doctor and they prescribed some abx.  States that today it wasn't getting any better and was told to come to the er.

## 2020-07-16 NOTE — Discharge Instructions (Signed)
Please call Dr. Constance Haw office tomorrow morning to be seen in the office sometime tomorrow afternoon for further evaluation of your abscess  Continue taking your antibiotic as prescribed. It is important to take the entire course of antibiotics to cover for infection.   Take Tylenol as needed for fevers > 100.4. Drink plenty of fluids to stay hydrated.   I have prescribed a short course of pain medication that contains Tylenol in it. Do not exceed total daily dose of 4,000 mg of Tylenol. You can also take Ibuprofen as needed for pain.   While at home you can do sitz baths to help with inflammation and lightly massage the area to allow for additional drainage of pus. I would also recommend Miralax daily to help soften the stools.

## 2020-07-17 ENCOUNTER — Encounter: Payer: Self-pay | Admitting: General Surgery

## 2020-07-17 ENCOUNTER — Other Ambulatory Visit: Payer: Self-pay | Admitting: Family Medicine

## 2020-07-17 ENCOUNTER — Ambulatory Visit: Payer: Managed Care, Other (non HMO) | Admitting: General Surgery

## 2020-07-17 VITALS — BP 127/93 | HR 94 | Temp 97.1°F | Resp 16 | Ht 70.0 in | Wt 201.0 lb

## 2020-07-17 DIAGNOSIS — K610 Anal abscess: Secondary | ICD-10-CM | POA: Diagnosis not present

## 2020-07-17 DIAGNOSIS — K439 Ventral hernia without obstruction or gangrene: Secondary | ICD-10-CM

## 2020-07-17 NOTE — Patient Instructions (Signed)
Sitz baths as needed and with BMs. Keep stools regular and soft Get your pain meds filled in case you need to use any this weekend. Pack the area with packing daily (after sitz bath). Wear ladies maxi pad in your underwear. Will see you next Tuesday

## 2020-07-17 NOTE — Progress Notes (Signed)
[facility] History and Physical  Reason for Referral: Perianal abscess  Referring Physician:   ED   Chief Complaint    Follow-up      Shannon Ritter is a 50 y.o. male.  HPI: Shannon Ritter is a pretty healthy 50 yo who was seen in the Ed with perianal pain for the past few days and swelling. He has never had anything like this before. The pain was pretty severe and caused him to have to go to the ED. He was found to have a perianal abscess that was remote from the anal verge on CT. The ED drained the area and discussed the patient with me. He was sent home on antibiotics and had packing in place.  He says his pain is slightly better but he is still having issues sitting. The only thing that gives him relief is sitz baths.  Past Medical History:  Diagnosis Date  . Hypertension     History reviewed. No pertinent surgical history.  History reviewed. No pertinent family history.  Social History   Tobacco Use  . Smoking status: Current Some Day Smoker  . Smokeless tobacco: Never Used  Vaping Use  . Vaping Use: Never used  Substance Use Topics  . Alcohol use: Yes  . Drug use: Never    Medications: I have reviewed the patient's current medications. Allergies as of 07/17/2020   No Known Allergies     Medication List       Accurate as of July 17, 2020 11:59 PM. If you have any questions, ask your nurse or doctor.        STOP taking these medications   Blood Pressure Monitor/Arm Devi Stopped by: Virl Cagey, MD   lisinopril 20 MG tablet Commonly known as: ZESTRIL Stopped by: Virl Cagey, MD     TAKE these medications   ALPRAZolam 1 MG tablet Commonly known as: XANAX Take 1 mg by mouth at bedtime.   amLODipine 5 MG tablet Commonly known as: NORVASC Take by mouth.   aspirin 81 MG tablet Take 81 mg by mouth daily.   atorvastatin 40 MG tablet Commonly known as: LIPITOR Take 20 mg by mouth daily.   cetirizine 10 MG tablet Commonly known as:  ZYRTEC Take 10 mg by mouth daily.   hydrochlorothiazide 25 MG tablet Commonly known as: HYDRODIURIL Take 25 mg by mouth daily.   MULTIVITAMIN ADULT PO Take 1 tablet by mouth daily.   oxyCODONE-acetaminophen 5-325 MG tablet Commonly known as: PERCOCET/ROXICET Take 1 tablet by mouth every 6 (six) hours as needed for severe pain.   pantoprazole 40 MG tablet Commonly known as: PROTONIX Take 40 mg by mouth daily.   sulfamethoxazole-trimethoprim 800-160 MG tablet Commonly known as: BACTRIM DS Take 1 tablet by mouth 2 (two) times daily.        ROS:  A comprehensive review of systems was negative except for: Gastrointestinal: positive for perianal pain and tenderness  Blood pressure (!) 127/93, pulse 94, temperature (!) 97.1 F (36.2 C), temperature source Other (Comment), resp. rate 16, height 5\' 10"  (1.778 m), weight 201 lb (91.2 kg), SpO2 98 %. Physical Exam Vitals reviewed.  Constitutional:      Appearance: Normal appearance.  HENT:     Head: Normocephalic.     Nose: Nose normal.  Eyes:     Extraocular Movements: Extraocular movements intact.  Cardiovascular:     Rate and Rhythm: Normal rate and regular rhythm.  Pulmonary:     Effort:  Pulmonary effort is normal.     Breath sounds: Normal breath sounds.  Abdominal:     General: There is no distension.     Palpations: Abdomen is soft.     Tenderness: There is no abdominal tenderness.  Genitourinary:    Rectum: Tenderness present. No mass or external hemorrhoid.     Comments: Posterior incision about 1cm in size, packing removed, more fluctuance toward glute, minor erythema  Musculoskeletal:        General: Normal range of motion.     Cervical back: Normal range of motion.  Skin:    General: Skin is warm.  Neurological:     General: No focal deficit present.     Mental Status: He is alert and oriented to person, place, and time.  Psychiatric:        Mood and Affect: Mood normal.        Behavior: Behavior normal.         Thought Content: Thought content normal.        Judgment: Judgment normal.     Results: Personally reviewed CT- perianal abscess, superficial  CLINICAL DATA:  Fever, chills, possible rectal abscess  EXAM: CT PELVIS WITH CONTRAST  TECHNIQUE: Multidetector CT imaging of the pelvis was performed using the standard protocol following the bolus administration of intravenous contrast.  CONTRAST:  170mL OMNIPAQUE IOHEXOL 300 MG/ML  SOLN  COMPARISON:  None.  FINDINGS: Urinary Tract: Visualized portions of the kidneys, ureters, and bladder are unremarkable.  Bowel: No bowel obstruction or ileus. Minimal sigmoid diverticulosis without diverticulitis. Normal appendix right lower quadrant. No bowel wall thickening or inflammatory change.  Vascular/Lymphatic: Borderline enlarged inguinal lymph nodes are seen, measuring up to 12 mm on the right. These are likely reactive.  Minimal atherosclerosis of the aorta and iliac vessels.  Reproductive:  Prostate is not enlarged.  Other: In the medial aspect of the right buttock there is a 4.9 x 3.5 by 3.8 cm abscess and phlegmon, posterior to the anal verge.  No free intraperitoneal fluid or free gas. No abdominal wall hernia.  Musculoskeletal: No acute or destructive bony lesion. Reconstructed images demonstrate no additional findings.  IMPRESSION: 1. Superficial abscess within the right medial gluteal fold, posterior to the anal verge. 2. Diverticulosis without diverticulitis. 3.  Aortic Atherosclerosis (ICD10-I70.0).   Electronically Signed   By: Randa Ngo M.D.   On: 07/16/2020 17:51  Procedure: Incision and drainage of perianal abscess  Pre-procedure diagnosis: Inadequately drained perianal abscess Post procedure diagnosis: Same  Description: The perianal area was prepped with betadine. The prior incision was less than 1 cm in size and extending away from the anus was more fluctuance. Lidocaine 1%  was injected into the area.  A 11 blade was used to extend the incision onto the glute. Bloody drainage was expressed. Packing gauze was placed after saline flush.  The patient felt relief after the procedure from his continued pain.  Dry gauze and papertape were placed.   Assessment & Plan:  Shannon Ritter is a 50 y.o. male with a perianal abscess that was partially drained. I did not want it to close back up and refill so I opened it up further.  Discussed that some of these form into or from fistulas and if he has recurrences then we would have to expect a fistula.   Sitz baths as needed and with BMs. Keep stools regular and soft Get your pain meds filled in case you need to use any this weekend.  Pack the area with packing daily (after sitz bath). Wear ladies maxi pad in your underwear. Will see you next Tuesday   Future Appointments  Date Time Provider [place]  07/22/2020  9:15 AM Virl Cagey, MD [facility] None     All questions were answered to the satisfaction of the patient.    Virl Cagey 07/20/2020, 12:17 PM

## 2020-07-20 LAB — AEROBIC CULTURE W GRAM STAIN (SUPERFICIAL SPECIMEN)

## 2020-07-21 LAB — CULTURE, BLOOD (ROUTINE X 2)
Culture: NO GROWTH
Culture: NO GROWTH
Special Requests: ADEQUATE

## 2020-07-21 NOTE — Progress Notes (Signed)
ED Antimicrobial Stewardship Positive Culture Follow Up   Shannon Ritter is an 50 y.o. male who presented to [facility] on 07/16/2020 with a chief complaint of perianal abscess.   Recent Results (from the past 720 hour(s))  Culture, blood (routine x 2)     Status: None   Collection Time: 07/16/20  2:20 PM   Specimen: BLOOD  Result Value Ref Range Status   Specimen Description BLOOD RIGHT ANTECUBITAL  Final   Special Requests   Final    BOTTLES DRAWN AEROBIC AND ANAEROBIC Blood Culture adequate volume   Culture   Final    NO GROWTH 5 DAYS Performed at [facility], [address] [place], [place] [address]    Report Status 07/21/2020 FINAL  Final  Culture, blood (routine x 2)     Status: None   Collection Time: 07/16/20  2:20 PM   Specimen: BLOOD RIGHT FOREARM  Result Value Ref Range Status   Specimen Description BLOOD RIGHT FOREARM  Final   Special Requests   Final    BOTTLES DRAWN AEROBIC AND ANAEROBIC Blood Culture adequate volume   Culture   Final    NO GROWTH 5 DAYS Performed at [facility], [address] [place], [place] [address]    Report Status 07/21/2020 FINAL  Final  Aerobic Culture w Gram Stain (superficial specimen)     Status: None   Collection Time: 07/16/20  6:54 PM   Specimen: Abscess  Result Value Ref Range Status   Specimen Description   Final    ABSCESS BUTTOCKS Performed at [facility], [address] [place], [place] [address]    Special Requests   Final    NONE Performed at [facility], [address] [place], [place] [address]    Gram Stain   Final    FEW WBC PRESENT, PREDOMINANTLY PMN RARE GRAM NEGATIVE RODS Performed at [facility], [place] [address], [place], [place] [address]    Culture   Final    RARE ESCHERICHIA COLI RARE STREPTOCOCCUS ANGINOSIS    Report Status 07/20/2020 FINAL  Final   Organism ID, Bacteria ESCHERICHIA COLI  Final   Organism ID, Bacteria STREPTOCOCCUS ANGINOSIS  Final      Susceptibility   Escherichia coli -  MIC*    AMPICILLIN <=2 SENSITIVE Sensitive     CEFAZOLIN <=4 SENSITIVE Sensitive     CEFEPIME <=0.12 SENSITIVE Sensitive     CEFTAZIDIME <=1 SENSITIVE Sensitive     CEFTRIAXONE <=0.25 SENSITIVE Sensitive     CIPROFLOXACIN <=0.25 SENSITIVE Sensitive     GENTAMICIN <=1 SENSITIVE Sensitive     IMIPENEM <=0.25 SENSITIVE Sensitive     TRIMETH/SULFA <=20 SENSITIVE Sensitive     AMPICILLIN/SULBACTAM <=2 SENSITIVE Sensitive     PIP/TAZO <=4 SENSITIVE Sensitive     * RARE ESCHERICHIA COLI   Streptococcus anginosis - MIC*    PENICILLIN 0.12 SENSITIVE Sensitive     CEFTRIAXONE 0.5 SENSITIVE Sensitive     ERYTHROMYCIN >=8 RESISTANT Resistant     LEVOFLOXACIN 1 SENSITIVE Sensitive     VANCOMYCIN 0.5 SENSITIVE Sensitive     * RARE STREPTOCOCCUS ANGINOSIS    - Treated with Bactrim DS, organism resistant to prescribed antimicrobial  Please contact patient to see if he is having any further symptoms or if abscess has not resolved. - If he is feeling well, no new antibiotics necessary  - If he is having continued or worsening symptoms, start Augmentin 500 mg PO BID x5 days.  ED Provider: Dorie Rank, MD    Thea Gist 07/21/2020, 9:25 AM Clinical Pharmacist Monday - Friday phone -  [phone] Saturday - Sunday phone - [phone]

## 2020-07-22 ENCOUNTER — Other Ambulatory Visit: Payer: Self-pay

## 2020-07-22 ENCOUNTER — Ambulatory Visit (INDEPENDENT_AMBULATORY_CARE_PROVIDER_SITE_OTHER): Payer: Self-pay | Admitting: General Surgery

## 2020-07-22 ENCOUNTER — Other Ambulatory Visit: Payer: Self-pay | Admitting: Family Medicine

## 2020-07-22 ENCOUNTER — Encounter: Payer: Self-pay | Admitting: General Surgery

## 2020-07-22 VITALS — BP 119/82 | HR 76 | Temp 97.5°F | Resp 16 | Ht 70.0 in | Wt 198.0 lb

## 2020-07-22 DIAGNOSIS — K610 Anal abscess: Secondary | ICD-10-CM

## 2020-07-22 NOTE — Progress Notes (Signed)
[facility] Note   HPI:  50 y.o. Male presents to clinic for post-op follow-up evaluation of his perianal abscess. His wife has been packing the area and comes in with him. No major issues and some drainage. Improving pain. He is going to work when he can.  .   Review of Systems:  No fever  Improving pain All other review of systems: otherwise negative   Vital Signs:  BP 119/82   Pulse 76   Temp (!) 97.5 F (36.4 C) (Other (Comment))   Resp 16   Ht 5\' 10"  (1.778 m)   Wt 198 lb (89.8 kg)   SpO2 94%   BMI 28.41 kg/m    Physical Exam:  Physical Exam Vitals reviewed.  Genitourinary:    Comments: Perianal incision on right posterior perianal region with some bleeding, packing replaced, tenderness improved      Assessment:  50 y.o. yo Male s/p I&D of perianal abscess. Doing better.  Plan:  Continue packing for now. Finish antibiotic.  Sitz baths.  Work note given until next week   Future Appointments  Date Time Provider [place]  07/29/2020  9:45 AM Constance Haw, Lanell Matar, MD [facility] None      Curlene Labrum, MD [facility] [address] [place], [place] [address] [phone] (office)

## 2020-07-22 NOTE — Patient Instructions (Addendum)
Continue packing for now. Finish antibiotic.  Sitz baths.

## 2020-07-29 ENCOUNTER — Ambulatory Visit (INDEPENDENT_AMBULATORY_CARE_PROVIDER_SITE_OTHER): Payer: Self-pay | Admitting: General Surgery

## 2020-07-29 ENCOUNTER — Other Ambulatory Visit: Payer: Self-pay

## 2020-07-29 ENCOUNTER — Encounter: Payer: Self-pay | Admitting: General Surgery

## 2020-07-29 VITALS — BP 129/90 | HR 70 | Temp 97.1°F | Resp 12 | Ht 70.0 in | Wt 203.0 lb

## 2020-07-29 DIAGNOSIS — K610 Anal abscess: Secondary | ICD-10-CM

## 2020-07-29 NOTE — Progress Notes (Signed)
[facility] Note   HPI:  50 y.o. Male presents to clinic for post-op follow-up evaluation of his perianal abscess. He is not having to pack it any more and no further drainage. The pain is resolved.  Review of Systems:  No drainage No pain All other review of systems: otherwise negative   Vital Signs:  BP 129/90   Pulse 70   Temp (!) 97.1 F (36.2 C) (Other (Comment))   Resp 12   Ht 5\' 10"  (1.778 m)   Wt 203 lb (92.1 kg)   SpO2 94%   BMI 29.13 kg/m    Physical Exam:  Physical Exam Vitals reviewed.  Cardiovascular:     Rate and Rhythm: Normal rate.  Pulmonary:     Effort: Pulmonary effort is normal.  Genitourinary:    Comments: Perianal incision right posterior almost completely healed, no erythema or tenderness, no drainage    Assessment:  50 y.o. yo Male with perianal abscess that has resolved. Overall improved. Warned if recurs could have fistula formation.   Plan:  - PRN follow up  - Keep area clean until skin completely healed     Curlene Labrum, MD [facility] [place], [place] [address] [phone] (office)

## 2020-09-16 ENCOUNTER — Other Ambulatory Visit: Payer: Self-pay

## 2020-09-16 ENCOUNTER — Encounter: Payer: Self-pay | Admitting: General Surgery

## 2020-09-16 ENCOUNTER — Ambulatory Visit: Payer: Managed Care, Other (non HMO) | Admitting: General Surgery

## 2020-09-16 VITALS — BP 146/115 | HR 83 | Temp 98.6°F | Resp 14 | Ht 70.0 in | Wt 203.0 lb

## 2020-09-16 DIAGNOSIS — K610 Anal abscess: Secondary | ICD-10-CM

## 2020-09-16 NOTE — Patient Instructions (Signed)
Use the cream as needed 4-6 times a day.  Will see how your symptoms do and follow up in 4 weeks.

## 2020-09-16 NOTE — Progress Notes (Signed)
[facility] Note   HPI:  50 y.o. Male presents to clinic for follow-up evaluation of his perianal abscess area. He says he thinks he had a hemorrhoid flare with a large hemorrhoid protruding and tenderness in the area.  He says that he has had some minor bleeding but nothing major.  Review of Systems:  Some minor yellow drainage at times  Minor bleeding Pain and discomfort/ swelling in area/ hemorrhoid  All other review of systems: otherwise negative   Vital Signs:  BP (!) 146/115   Pulse 83   Temp 98.6 F (37 C) (Other (Comment))   Resp 14   Ht 5\' 10"  (1.778 m)   Wt 203 lb (92.1 kg)   SpO2 95%   BMI 29.13 kg/m    Physical Exam:  Physical Exam Vitals reviewed.  Cardiovascular:     Rate and Rhythm: Normal rate.  Pulmonary:     Effort: Pulmonary effort is normal.  Abdominal:     General: There is no distension.     Palpations: Abdomen is soft.     Tenderness: There is no abdominal tenderness.  Genitourinary:    Comments: Healed posterior incision, minor skin tag, no obvious large external hemorrhoids or tag,  Neurological:     Mental Status: He is alert.      Assessment:  50 y.o. yo Male with possible hemorrhoid issues in the setting of recent abscess. We discussed that he could also have a fistula. Discussed the pain associated with hemorrhoid surgery. He is having regular Bms.  Plan:  Use the recticare cream as needed 4-6 times a day.  Will see how your symptoms do and follow up in 4 weeks.   Future Appointments  Date Time Provider [place]  10/14/2020 10:45 AM Virl Cagey, MD [facility] None     Curlene Labrum, MD [facility] [address] [place], [place] [address] [phone] (office)

## 2020-10-14 ENCOUNTER — Ambulatory Visit: Payer: Managed Care, Other (non HMO) | Admitting: General Surgery

## 2020-10-14 ENCOUNTER — Other Ambulatory Visit: Payer: Self-pay

## 2020-10-14 ENCOUNTER — Encounter: Payer: Self-pay | Admitting: General Surgery

## 2020-10-14 VITALS — BP 167/112 | HR 100 | Temp 98.6°F | Resp 16 | Ht 70.0 in | Wt 207.0 lb

## 2020-10-14 DIAGNOSIS — K610 Anal abscess: Secondary | ICD-10-CM

## 2020-10-14 DIAGNOSIS — K603 Anal fistula: Secondary | ICD-10-CM

## 2020-10-14 NOTE — Progress Notes (Signed)
[facility] History and Physical   Chief Complaint    Follow-up      Shannon Ritter is a 50 y.o. male.  HPI: Shannon Ritter is a 50 yo s/p I&D of a perianal abscess who continues to have some pain and drainage in the area. He had presented with what he felt like was a lump from a hemorrhoid last month but I noted nothing on exam. He says that he has yellow drainage in his underwear and says he has burning pain constantly in the area.  These issues have been going on since we say him last.    Past Medical History:  Diagnosis Date  . Hypertension     History reviewed. No pertinent surgical history.  History reviewed. No pertinent family history.  Social History   Tobacco Use  . Smoking status: Current Some Day Smoker  . Smokeless tobacco: Never Used  Vaping Use  . Vaping Use: Never used  Substance Use Topics  . Alcohol use: Yes  . Drug use: Never    Medications: I have reviewed the patient's current medications. Allergies as of 10/14/2020   No Known Allergies     Medication List       Accurate as of Oct 14, 2020 12:15 PM. If you have any questions, ask your nurse or doctor.        ALPRAZolam 1 MG tablet Commonly known as: XANAX Take 1 mg by mouth at bedtime.   amLODipine 5 MG tablet Commonly known as: NORVASC Take by mouth.   aspirin 81 MG tablet Take 81 mg by mouth daily.   atorvastatin 40 MG tablet Commonly known as: LIPITOR Take 20 mg by mouth daily.   cetirizine 10 MG tablet Commonly known as: ZYRTEC Take 10 mg by mouth daily.   hydrochlorothiazide 25 MG tablet Commonly known as: HYDRODIURIL Take 25 mg by mouth daily.   MULTIVITAMIN ADULT PO Take 1 tablet by mouth daily.   pantoprazole 40 MG tablet Commonly known as: PROTONIX Take 40 mg by mouth daily.        ROS:  A comprehensive review of systems was negative except for: Gastrointestinal: positive for perianal drainage and burning  Blood pressure (!) 167/112, pulse 100,  temperature 98.6 F (37 C), temperature source Other (Comment), resp. rate 16, height 5\' 10"  (1.778 m), weight 207 lb (93.9 kg), SpO2 95 %. Physical Exam Vitals reviewed.  Constitutional:      Appearance: Normal appearance.  HENT:     Head: Normocephalic.     Mouth/Throat:     Mouth: Mucous membranes are moist.  Eyes:     Extraocular Movements: Extraocular movements intact.  Cardiovascular:     Rate and Rhythm: Normal rate and regular rhythm.  Pulmonary:     Effort: Pulmonary effort is normal.     Breath sounds: Normal breath sounds.  Abdominal:     General: There is no distension.     Palpations: Abdomen is soft.     Tenderness: There is no abdominal tenderness.  Genitourinary:    Comments: Right of posterior midline granulation tissue hypotrophic, tender, difficult to appreciate if fistula in ano present but potentially open site toward verge  Musculoskeletal:        General: Normal range of motion.     Cervical back: Normal range of motion.  Skin:    General: Skin is warm.  Neurological:     General: No focal deficit present.     Mental Status: He is alert  and oriented to person, place, and time.  Psychiatric:        Mood and Affect: Mood normal.        Behavior: Behavior normal.        Thought Content: Thought content normal.        Judgment: Judgment normal.     Results: None  Assessment & Plan:  Shannon Ritter is a 50 y.o. 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 He has some hypertrophic granulation at the I&D site too which could explain some drainage otherwise if no fistula present. Discussed risk of bleeding, infection, injury to sphincter, incontinence, and needing additional procedure.  -Exam under anesthesia, fistulotomy versus seton  -Will plan for week 6/20 after his vacation   All questions were answered to the satisfaction of the patient.     Virl Cagey 10/14/2020, 12:15 PM

## 2020-10-14 NOTE — H&P (Signed)
[facility] History and Physical   Chief Complaint    Follow-up      Shannon Ritter is a 50 y.o. male.  HPI: Shannon Ritter is a 50 yo s/p I&D of a perianal abscess who continues to have some pain and drainage in the area. He had presented with what he felt like was a lump from a hemorrhoid last month but I noted nothing on exam. He says that he has yellow drainage in his underwear and says he has burning pain constantly in the area.  These issues have been going on since we say him last.    Past Medical History:  Diagnosis Date  . Hypertension     History reviewed. No pertinent surgical history.  History reviewed. No pertinent family history.  Social History   Tobacco Use  . Smoking status: Current Some Day Smoker  . Smokeless tobacco: Never Used  Vaping Use  . Vaping Use: Never used  Substance Use Topics  . Alcohol use: Yes  . Drug use: Never    Medications: I have reviewed the patient's current medications. Allergies as of 10/14/2020   No Known Allergies     Medication List       Accurate as of Oct 14, 2020 12:15 PM. If you have any questions, ask your nurse or doctor.        ALPRAZolam 1 MG tablet Commonly known as: XANAX Take 1 mg by mouth at bedtime.   amLODipine 5 MG tablet Commonly known as: NORVASC Take by mouth.   aspirin 81 MG tablet Take 81 mg by mouth daily.   atorvastatin 40 MG tablet Commonly known as: LIPITOR Take 20 mg by mouth daily.   cetirizine 10 MG tablet Commonly known as: ZYRTEC Take 10 mg by mouth daily.   hydrochlorothiazide 25 MG tablet Commonly known as: HYDRODIURIL Take 25 mg by mouth daily.   MULTIVITAMIN ADULT PO Take 1 tablet by mouth daily.   pantoprazole 40 MG tablet Commonly known as: PROTONIX Take 40 mg by mouth daily.        ROS:  A comprehensive review of systems was negative except for: Gastrointestinal: positive for perianal drainage and burning  Blood pressure (!) 167/112, pulse 100,  temperature 98.6 F (37 C), temperature source Other (Comment), resp. rate 16, height 5\' 10"  (1.778 m), weight 207 lb (93.9 kg), SpO2 95 %. Physical Exam Vitals reviewed.  Constitutional:      Appearance: Normal appearance.  HENT:     Head: Normocephalic.     Mouth/Throat:     Mouth: Mucous membranes are moist.  Eyes:     Extraocular Movements: Extraocular movements intact.  Cardiovascular:     Rate and Rhythm: Normal rate and regular rhythm.  Pulmonary:     Effort: Pulmonary effort is normal.     Breath sounds: Normal breath sounds.  Abdominal:     General: There is no distension.     Palpations: Abdomen is soft.     Tenderness: There is no abdominal tenderness.  Genitourinary:    Comments: Right of posterior midline granulation tissue hypotrophic, tender, difficult to appreciate if fistula in ano present but potentially open site toward verge  Musculoskeletal:        General: Normal range of motion.     Cervical back: Normal range of motion.  Skin:    General: Skin is warm.  Neurological:     General: No focal deficit present.     Mental Status: He is alert  and oriented to person, place, and time.  Psychiatric:        Mood and Affect: Mood normal.        Behavior: Behavior normal.        Thought Content: Thought content normal.        Judgment: Judgment normal.     Results: None  Assessment & Plan:  Shannon Ritter is a 50 y.o. 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 He has some hypertrophic granulation at the I&D site too which could explain some drainage otherwise if no fistula present. Discussed risk of bleeding, infection, injury to sphincter, incontinence, and needing additional procedure.  -Exam under anesthesia, fistulotomy versus seton  -Will plan for week 6/20 after his vacation   All questions were answered to the satisfaction of the patient.     Virl Cagey 10/14/2020, 12:15 PM

## 2020-10-14 NOTE — Patient Instructions (Signed)
Anal Fistulotomy  Anal fistulotomy is a surgical procedure to open and drain an anal fistula. An anal fistula is an abnormal tunnel that develops between the bowel and the skin near the outside of the anus, where stool (feces) comes out. During this procedure, the fistula is opened up and the contents are drained to promote healing. Tell a health care provider about:  Any allergies you have.  All medicines you are taking, including vitamins, herbs, eye drops, creams, and over-the-counter medicines.  Any problems you or family members have had with anesthetic medicines.  Any blood disorders you have.  Any surgeries you have had.  Any medical conditions you have.  Any problems you have controlling your bowel movements (incontinence).  Whether you are pregnant or may be pregnant. What are the risks? Generally, this is a safe procedure. However, problems may occur, including:  Infection.  Bleeding.  Allergic reactions to medicines or dyes.  Damage to nearby structures or organs.  Incontinence or leaking of stool.  Being unable to empty your bladder (urinary retention).  Needing more surgery, if the fistula returns. What happens before the procedure? Medicines Ask your health care provider about:  Changing or stopping your regular medicines. This is especially important if you are taking diabetes medicines or blood thinners.  Taking medicines such as aspirin and ibuprofen. These medicines can thin your blood. Do not take these medicines unless your health care provider tells you to take them.  Taking over-the-counter medicines, vitamins, herbs, and supplements. Surgery safety Ask your health care provider:  How your surgery site will be marked.  What steps will be taken to help prevent infection. These may include: ? Removing hair at the surgery site. ? Washing skin with a germ-killing soap. ? Taking antibiotic medicine. General instructions  Do not use any products  that contain nicotine or tobacco for at least 4 weeks before the procedure. These products include cigarettes, e-cigarettes, and chewing tobacco. If you need help quitting, ask your health care provider.  You may have an exam or testing.  You may have a blood or urine sample taken.  You may be told to take a medicine that helps you have a bowel movement (laxative) or use an enema to clean your bowels before surgery.  Plan to have someone take you home from the hospital or clinic.  Plan to have a responsible adult care for you for at least 24 hours after you leave the hospital or clinic. This is important. What happens during the procedure?  An IV will be inserted into one of your veins.  You will be given one or more of the following: ? A medicine to help you relax (sedative). ? A medicine to numb the area (local anesthetic). ? A medicine to make you fall asleep (general anesthetic).  Your surgeon will locate the internal opening of your fistula.  An incision will be made in the fistula opening. The incision may extend into the muscles around the anus (sphincter muscles).  The fistula will be cut open and drained.  Gauze bandages (dressings) may be placed inside of the fistula. The procedure may vary among health care providers and hospitals. What happens after the procedure?  Your blood pressure, heart rate, breathing rate, and blood oxygen level will be monitored until you leave the hospital or clinic.  Do not drive for 24 hours if you were given a sedative during your procedure.  You may continue to receive fluids and medicines through an IV.  You may have some bleeding from your incision. You may have to wear a pad to absorb blood.   Summary  Anal fistulotomy is a surgical procedure to open and drain an anal fistula, which is an abnormal tunnel that develops between the bowel and the skin near the outside of the anus.  Before the procedure, tell your health care provider  about any medical problems you have or have had, including problems controlling bowel movements.  You will be told what to eat and drink before the procedure, and what medicines to change or stop. Follow these instructions carefully.  This is a safe procedure. However, problems may occur, including incontinence or leaking of stool and being unable to empty your bladder (urinary retention).  You may have some bleeding from your incision after the procedure. You may have to wear a pad to absorb blood. This information is not intended to replace advice given to you by your health care provider. Make sure you discuss any questions you have with your health care provider. Document Revised: 10/16/2018 Document Reviewed: 10/16/2018 Elsevier Patient Education  [place].

## 2020-10-31 NOTE — Patient Instructions (Signed)
Shannon Ritter  10/31/2020     @PREFPERIOPPHARMACY @   Your procedure is scheduled on 11/07/2020.   Report to [name] at  0730  A.M.   Call this number if you have problems the morning of surgery:  [phone]   Remember:  Do not eat or drink after midnight.      Take these medicines the morning of surgery with A SIP OF WATER     Amlodipine, zyrtec, protonix.     Please brush your teeth.  Do not wear jewelry, make-up or nail polish.  Do not wear lotions, powders, or perfumes, or deodorant.  Do not shave 48 hours prior to surgery.  Men may shave face and neck.  Do not bring valuables to the hospital.  [facility] is not responsible for any belongings or valuables.  Contacts, dentures or bridgework may not be worn into surgery.  Leave your suitcase in the car.  After surgery it may be brought to your room.  For patients admitted to the hospital, discharge time will be determined by your treatment team.  Patients discharged the day of surgery will not be allowed to drive home and must have someone with them for 24 hours.    Special instructions:   DO NOT smoke tobacco or vape for 24 hours before your procedure.  Please read over the following fact sheets that you were given. Coughing and Deep Breathing, Surgical Site Infection Prevention, Anesthesia Post-op Instructions, and Care and Recovery After Surgery      Anal Fistulotomy, Care After This sheet gives you information about how to care for yourself after your procedure. Your health care provider may also give you more specific instructions. If you have problems or questions, contact your health careprovider. What can I expect after the procedure? After the procedure, it is common to have: Some pain, discomfort, and swelling. Increased pain during bowel movements. Some bleeding from the incision area. Some leakage of stool. Follow these instructions at home: Medicines Take over-the-counter and  prescription medicines only as told by your health care provider. If you were prescribed an antibiotic medicine, take it as told by your health care provider. Do not stop taking the antibiotic even if you start to feel better. Ask your health care provider if the medicine prescribed to you requires you to avoid driving or using heavy machinery. Incision care  Follow instructions from your health care provider about how to take care of your incision. Make sure you: Wash your hands with soap and water before and after you remove your gauze (dressing). If soap and water are not available, use hand sanitizer. Remove your dressing as told by your health care provider. In some cases, you may be told not to remove the dressing, but to allow it to come out with your first bowel movement after surgery. Leave stitches (sutures), skin glue, or adhesive strips in place. These skin closures may need to stay in place for 2 weeks or longer. Do not remove adhesive strips completely unless your health care provider tells you to do that. Keep the incision area clean and dry. Check your incision area every day for signs of infection. Check for: More redness, swelling, or pain. More fluid or blood. Warmth. Pus or a bad smell.  Self-care After a bowel movement, clean the incision area using one of the following methods: Gently wipe with a moist towelette. Gently wipe with mild soap and water. Take a shower.  Take a sitz bath. This is a shallow, warm-water bath that attaches to the toilet bowl. You can also sit in a bathtub filled with warm water. Do not swim or use a hot tub until your health care provider approves. To reduce discomfort, you may apply ice to the incision area. To do this: Put ice in a plastic bag. Place a towel between your skin and the bag. Leave the ice on for 20 minutes, 2-3 times a day. Activity  Rest as told by your health care provider. Avoid sitting for a long time without moving.  Get up to take short walks every 1-2 hours. This is important to improve blood flow and breathing. Ask for help if you feel weak or unsteady. Do not drive for 24 hours if you were given a sedative during your procedure. Do not lift anything that is heavier than 10 lb (4.5 kg), or the limit that you are told, until your health care provider says that it is safe. Return to your normal activities as told by your health care provider. Ask your health care provider what activities are safe for you.  Eating and drinking Follow instructions from your health care provider about eating or drinking restrictions. You may need to take these actions to prevent or treat constipation: Drink enough fluid to keep your urine pale yellow. Eat foods that are high in fiber, such as beans, whole grains, and fresh fruits and vegetables. Limit foods that are high in fat and processed sugars, such as fried or sweet foods. Lifestyle Do not use any products that contain nicotine or tobacco, such as cigarettes, e-cigarettes, and chewing tobacco. If you need help quitting, ask your health care provider. Do not drink alcohol if: Your health care provider tells you not to drink. You are pregnant, may be pregnant, or are planning to become pregnant. If you drink alcohol: Limit how much you use to: 0-1 drink a day for women. 0-2 drinks a day for men. Be aware of how much alcohol is in your drink. In the [place] one drink equals one 12 oz bottle of beer (355 mL), one 5 oz glass of wine (148 mL), or one 1 oz glass of hard liquor (44 mL). General instructions If you have bleeding from the incision area, wear a pad to absorb blood. Change it often. Keep all follow-up visits as told by your health care provider. This is important. Contact a health care provider if: You have any of these signs of infection: More redness, swelling, or pain around your incision area. Warmth coming from your incision area, or your incision area is  firm. Pus or a bad smell coming from your incision area. A fever or chills. You develop swelling or tenderness in your groin area. You cannot control your bowel movements (incontinence), or you are leaking stool. You have trouble urinating. You have pain that does not get better with medicine. Get help right away if: You have severe pain in your abdomen or incision area. You have sudden chest pain. You become weak or you faint. You have more fluid or blood coming from your incision. You have bleeding from your incision that soaks 2 or more pads during 24 hours. Summary After anal fistulotomy, it is common to have increased pain during bowel movements and some bleeding. If a dressing was placed in your incision during surgery, remove it as told by your health care provider. It is important to keep the incision area clean and dry after each  bowel movement. If you have bleeding from the incision area, wear a pad to absorb blood. Change it often. This information is not intended to replace advice given to you by your health care provider. Make sure you discuss any questions you have with your healthcare provider. Document Revised: 10/16/2018 Document Reviewed: 10/16/2018 Elsevier Patient Education  [place] Anesthesia, Adult, Care After This sheet gives you information about how to care for yourself after your procedure. Your health care provider may also give you more specific instructions. If you have problems or questions, contact your health careprovider. What can I expect after the procedure? After the procedure, the following side effects are common: Pain or discomfort at the IV site. Nausea. Vomiting. Sore throat. Trouble concentrating. Feeling cold or chills. Feeling weak or tired. Sleepiness and fatigue. Soreness and body aches. These side effects can affect parts of the body that were not involved in surgery. Follow these instructions at home: For the time  period you were told by your health care provider:  Rest. Do not participate in activities where you could fall or become injured. Do not drive or use machinery. Do not drink alcohol. Do not take sleeping pills or medicines that cause drowsiness. Do not make important decisions or sign legal documents. Do not take care of children on your own.  Eating and drinking Follow any instructions from your health care provider about eating or drinking restrictions. When you feel hungry, start by eating small amounts of foods that are soft and easy to digest (bland), such as toast. Gradually return to your regular diet. Drink enough fluid to keep your urine pale yellow. If you vomit, rehydrate by drinking water, juice, or clear broth. General instructions If you have sleep apnea, surgery and certain medicines can increase your risk for breathing problems. Follow instructions from your health care provider about wearing your sleep device: Anytime you are sleeping, including during daytime naps. While taking prescription pain medicines, sleeping medicines, or medicines that make you drowsy. Have a responsible adult stay with you for the time you are told. It is important to have someone help care for you until you are awake and alert. Return to your normal activities as told by your health care provider. Ask your health care provider what activities are safe for you. Take over-the-counter and prescription medicines only as told by your health care provider. If you smoke, do not smoke without supervision. Keep all follow-up visits as told by your health care provider. This is important. Contact a health care provider if: You have nausea or vomiting that does not get better with medicine. You cannot eat or drink without vomiting. You have pain that does not get better with medicine. You are unable to pass urine. You develop a skin rash. You have a fever. You have redness around your IV site that  gets worse. Get help right away if: You have difficulty breathing. You have chest pain. You have blood in your urine or stool, or you vomit blood. Summary After the procedure, it is common to have a sore throat or nausea. It is also common to feel tired. Have a responsible adult stay with you for the time you are told. It is important to have someone help care for you until you are awake and alert. When you feel hungry, start by eating small amounts of foods that are soft and easy to digest (bland), such as toast. Gradually return to your regular diet. Drink enough fluid to  keep your urine pale yellow. Return to your normal activities as told by your health care provider. Ask your health care provider what activities are safe for you. This information is not intended to replace advice given to you by your health care provider. Make sure you discuss any questions you have with your healthcare provider. Document Revised: 01/17/2020 Document Reviewed: 08/16/2019 Elsevier Patient Education  2022 [employer]. How to Use Chlorhexidine for Bathing Chlorhexidine gluconate (CHG) is a germ-killing (antiseptic) solution that is used to clean the skin. It can get rid of the bacteria that normally live on the skin and can keep them away for about 24 hours. To clean your skin with CHG, you may be given: A CHG solution to use in the shower or as part of a sponge bath. A prepackaged cloth that contains CHG. Cleaning your skin with CHG may help lower the risk for infection: While you are staying in the intensive care unit of the hospital. If you have a vascular access, such as a central line, to provide short-term or long-term access to your veins. If you have a catheter to drain urine from your bladder. If you are on a ventilator. A ventilator is a machine that helps you breathe by moving air in and out of your lungs. After surgery. What are the risks? Risks of using CHG include: A skin  reaction. Hearing loss, if CHG gets in your ears. Eye injury, if CHG gets in your eyes and is not rinsed out. The CHG product catching fire. Make sure that you avoid smoking and flames after applying CHG to your skin. Do not use CHG: If you have a chlorhexidine allergy or have previously reacted to chlorhexidine. On babies younger than 54 months of age. How to use CHG solution Use CHG only as told by your health care provider, and follow the instructions on the label. Use the full amount of CHG as directed. Usually, this is one bottle. During a shower Follow these steps when using CHG solution during a shower (unless your health care provider gives you different instructions): Start the shower. Use your normal soap and shampoo to wash your face and hair. Turn off the shower or move out of the shower stream. Pour the CHG onto a clean washcloth. Do not use any type of brush or rough-edged sponge. Starting at your neck, lather your body down to your toes. Make sure you follow these instructions: If you will be having surgery, pay special attention to the part of your body where you will be having surgery. Scrub this area for at least 1 minute. Do not use CHG on your head or face. If the solution gets into your ears or eyes, rinse them well with water. Avoid your genital area. Avoid any areas of skin that have broken skin, cuts, or scrapes. Scrub your back and under your arms. Make sure to wash skin folds. Let the lather sit on your skin for 1-2 minutes or as long as told by your health care provider. Thoroughly rinse your entire body in the shower. Make sure that all body creases and crevices are rinsed well. Dry off with a clean towel. Do not put any substances on your body afterward--such as powder, lotion, or perfume--unless you are told to do so by your health care provider. Only use lotions that are recommended by the manufacturer. Put on clean clothes or pajamas. If it is the night  before your surgery, sleep in clean sheets.  During a  sponge bath Follow these steps when using CHG solution during a sponge bath (unless your health care provider gives you different instructions): Use your normal soap and shampoo to wash your face and hair. Pour the CHG onto a clean washcloth. Starting at your neck, lather your body down to your toes. Make sure you follow these instructions: If you will be having surgery, pay special attention to the part of your body where you will be having surgery. Scrub this area for at least 1 minute. Do not use CHG on your head or face. If the solution gets into your ears or eyes, rinse them well with water. Avoid your genital area. Avoid any areas of skin that have broken skin, cuts, or scrapes. Scrub your back and under your arms. Make sure to wash skin folds. Let the lather sit on your skin for 1-2 minutes or as long as told by your health care provider. Using a different clean, wet washcloth, thoroughly rinse your entire body. Make sure that all body creases and crevices are rinsed well. Dry off with a clean towel. Do not put any substances on your body afterward--such as powder, lotion, or perfume--unless you are told to do so by your health care provider. Only use lotions that are recommended by the manufacturer. Put on clean clothes or pajamas. If it is the night before your surgery, sleep in clean sheets. How to use CHG prepackaged cloths Only use CHG cloths as told by your health care provider, and follow the instructions on the label. Use the CHG cloth on clean, dry skin. Do not use the CHG cloth on your head or face unless your health care provider tells you to. When washing with the CHG cloth: Avoid your genital area. Avoid any areas of skin that have broken skin, cuts, or scrapes. Before surgery Follow these steps when using a CHG cloth to clean before surgery (unless your health care provider gives you different instructions): Using  the CHG cloth, vigorously scrub the part of your body where you will be having surgery. Scrub using a back-and-forth motion for 3 minutes. The area on your body should be completely wet with CHG when you are done scrubbing. Do not rinse. Discard the cloth and let the area air-dry. Do not put any substances on the area afterward, such as powder, lotion, or perfume. Put on clean clothes or pajamas. If it is the night before your surgery, sleep in clean sheets.  For general bathing Follow these steps when using CHG cloths for general bathing (unless your health care provider gives you different instructions). Use a separate CHG cloth for each area of your body. Make sure you wash between any folds of skin and between your fingers and toes. Wash your body in the following order, switching to a new cloth after each step: The front of your neck, shoulders, and chest. Both of your arms, under your arms, and your hands. Your stomach and groin area, avoiding the genitals. Your right leg and foot. Your left leg and foot. The back of your neck, your back, and your buttocks. Do not rinse. Discard the cloth and let the area air-dry. Do not put any substances on your body afterward--such as powder, lotion, or perfume--unless you are told to do so by your health care provider. Only use lotions that are recommended by the manufacturer. Put on clean clothes or pajamas. Contact a health care provider if: Your skin gets irritated after scrubbing. You have questions about using your  solution or cloth. Get help right away if: Your eyes become very red or swollen. Your eyes itch badly. Your skin itches badly and is red or swollen. Your hearing changes. You have trouble seeing. You have swelling or tingling in your mouth or throat. You have trouble breathing. You swallow any chlorhexidine. Summary Chlorhexidine gluconate (CHG) is a germ-killing (antiseptic) solution that is used to clean the skin. Cleaning  your skin with CHG may help to lower your risk for infection. You may be given CHG to use for bathing. It may be in a bottle or in a prepackaged cloth to use on your skin. Carefully follow your health care provider's instructions and the instructions on the product label. Do not use CHG if you have a chlorhexidine allergy. Contact your health care provider if your skin gets irritated after scrubbing. This information is not intended to replace advice given to you by your health care provider. Make sure you discuss any questions you have with your healthcare provider. Document Revised: 09/14/2019 Document Reviewed: 10/19/2019 Elsevier Patient Education  [place].

## 2020-11-04 ENCOUNTER — Encounter (HOSPITAL_COMMUNITY): Payer: Self-pay

## 2020-11-04 ENCOUNTER — Other Ambulatory Visit: Payer: Self-pay

## 2020-11-04 ENCOUNTER — Encounter (HOSPITAL_COMMUNITY)
Admission: RE | Admit: 2020-11-04 | Discharge: 2020-11-04 | Disposition: A | Discharge status: Home / Self Care | Payer: Managed Care, Other (non HMO) | Source: Ambulatory Visit | Attending: General Surgery | Admitting: General Surgery

## 2020-11-04 DIAGNOSIS — Z01812 Encounter for preprocedural laboratory examination: Secondary | ICD-10-CM | POA: Insufficient documentation

## 2020-11-04 HISTORY — DX: Angina pectoris, unspecified: I20.9

## 2020-11-04 HISTORY — DX: Gastro-esophageal reflux disease without esophagitis: K21.9

## 2020-11-04 LAB — CBC WITH DIFFERENTIAL/PLATELET
Abs Immature Granulocytes: 0.05 10*3/uL (ref 0.00–0.07)
Basophils Absolute: 0.1 10*3/uL (ref 0.0–0.1)
Basophils Relative: 1 %
Eosinophils Absolute: 0.3 10*3/uL (ref 0.0–0.5)
Eosinophils Relative: 4 %
HCT: 46.4 % (ref 39.0–52.0)
Hemoglobin: 15.5 g/dL (ref 13.0–17.0)
Immature Granulocytes: 1 %
Lymphocytes Relative: 24 %
Lymphs Abs: 1.8 10*3/uL (ref 0.7–4.0)
MCH: 30.3 pg (ref 26.0–34.0)
MCHC: 33.4 g/dL (ref 30.0–36.0)
MCV: 90.6 fL (ref 80.0–100.0)
Monocytes Absolute: 0.6 10*3/uL (ref 0.1–1.0)
Monocytes Relative: 8 %
Neutro Abs: 4.6 10*3/uL (ref 1.7–7.7)
Neutrophils Relative %: 62 %
Platelets: 286 10*3/uL (ref 150–400)
RBC: 5.12 MIL/uL (ref 4.22–5.81)
RDW: 13 % (ref 11.5–15.5)
WBC: 7.3 10*3/uL (ref 4.0–10.5)
nRBC: 0 % (ref 0.0–0.2)

## 2020-11-04 LAB — BASIC METABOLIC PANEL
Anion gap: 12 (ref 5–15)
BUN: 16 mg/dL (ref 6–20)
CO2: 21 mmol/L — ABNORMAL LOW (ref 22–32)
Calcium: 9.3 mg/dL (ref 8.9–10.3)
Chloride: 100 mmol/L (ref 98–111)
Creatinine, Ser: 0.65 mg/dL (ref 0.61–1.24)
GFR, Estimated: >60 mL/min (ref >60)
Glucose, Bld: 107 mg/dL — ABNORMAL HIGH (ref 70–99)
Potassium: 3.8 mmol/L (ref 3.5–5.1)
Sodium: 133 mmol/L — ABNORMAL LOW (ref 135–145)

## 2020-11-04 NOTE — Pre-Procedure Instructions (Signed)
In for [name]. BP 146-160/111-120. HR 114. Patient states, "That's a good bp for me. It was higher than that the other day." States PCP took him off amlodipine because his blood pressure was better,"of course that was before I started drinking again and I had lost some weight because I was eating better". Patient states he has some CP on and off, without diaphoresis and SOB. States, "I have that  chest tightness sometimes". Cannot state if anything makes the chest pain better or worse. He has not seen Dr Gerarda Fraction in quite sometime". Has seen cardiology in the past but not since 2017. Reviewed chart with Dr Charna Elizabeth and he feels that patient at least needs to be evaluated and cleared for surgery by his PCP. I called Dr Nolon Rod office and got him an appointment for Wednesday, June 29 @ 0845. Patient will let Dr Constance Haw office know when he has been cleared for surgery. I also called and left message for office about above. Blair Hailey, or scheduler also notified.

## 2020-11-07 ENCOUNTER — Encounter (HOSPITAL_COMMUNITY): Admission: RE | Payer: Self-pay | Source: Home / Self Care

## 2020-11-07 ENCOUNTER — Ambulatory Visit (HOSPITAL_COMMUNITY)
Admission: RE | Admit: 2020-11-07 | Payer: Managed Care, Other (non HMO) | Source: Home / Self Care | Admitting: General Surgery

## 2020-11-07 DIAGNOSIS — K603 Anal fistula: Secondary | ICD-10-CM | POA: Diagnosis present

## 2020-11-07 SURGERY — ANAL FISTULOTOMY
Anesthesia: General

## 2020-12-04 NOTE — Progress Notes (Signed)
CARDIOLOGY CONSULT NOTE       Patient ID: Shannon Ritter MRN: [id number] DOB/AGE: 48-Nov-1972 48 y.o.  Admit date: (Not on file) Referring Physician: Gerarda Fraction Primary Physician: Redmond School, MD Primary Cardiologist: New Reason for Consultation: Chest pain/ Dyspne  Active Problems:   * No active hospital problems. *   HPI:  50 y.o. referred by Dr Gerarda Fraction for chest pain and dyspnea History of GERD and HTN and HLD on statin He Has no documented CAD despite angina being in his PMH. He has had issues with peri rectal abscess ? Fistula Needs more definitive surgery with Dr Constance Haw This has been going on since March 2022  He has some bad lifestyle issues. Drinks too much beer ETOH runs in family dad and uncle Smokes when he drinks BP labile with this and diet started on norvasc in addition to ACE/diuretic Recently   He saw Dr Einar Gip about 10 years ago for chest pain and had low risk non obstructive cath   ROS All other systems reviewed and negative except as noted above  Past Medical History:  Diagnosis Date   Angina pectoris ([place])    GERD (gastroesophageal reflux disease)    Hypertension     No family history on file.  Social History   Socioeconomic History   Marital status: Married    Spouse name: Not on file   Number of children: Not on file   Years of education: Not on file   Highest education level: Not on file  Occupational History   Not on file  Tobacco Use   Smoking status: Some Days    Packs/day: 0.25    Years: 30.00    Pack years: 7.50    Types: Cigarettes   Smokeless tobacco: Never  Vaping Use   Vaping Use: Never used  Substance and Sexual Activity   Alcohol use: Yes    Alcohol/week: 36.0 standard drinks    Types: 36 Cans of beer per week    Comment: 2-3 times week   Drug use: Never   Sexual activity: Yes  Other Topics Concern   Not on file  Social History Narrative   Not on file   Social Determinants of Health   Financial Resource Strain: Not on  file  Food Insecurity: Not on file  Transportation Needs: Not on file  Physical Activity: Not on file  Stress: Not on file  Social Connections: Not on file  Intimate Partner Violence: Not on file    Past Surgical History:  Procedure Laterality Date   ANTERIOR CRUCIATE LIGAMENT REPAIR Right    CARDIAC CATHETERIZATION        Current Outpatient Medications:    ALPRAZolam (XANAX) 1 MG tablet, Take 1 mg by mouth at bedtime., Disp: , Rfl:    amLODipine (NORVASC) 5 MG tablet, Take 5 mg by mouth daily., Disp: , Rfl:    Ascorbic Acid (VITAMIN C PO), Take 1 tablet by mouth daily., Disp: , Rfl:    aspirin 81 MG tablet, Take 81 mg by mouth daily., Disp: , Rfl:    atorvastatin (LIPITOR) 20 MG tablet, Take 20 mg by mouth daily. , Disp: , Rfl:    cetirizine (ZYRTEC) 10 MG tablet, Take 10 mg by mouth daily., Disp: , Rfl:    hydrochlorothiazide (HYDRODIURIL) 25 MG tablet, Take 25 mg by mouth daily., Disp: , Rfl:    Multiple Vitamins-Minerals (MULTIVITAMIN ADULT PO), Take 1 tablet by mouth daily., Disp: , Rfl:    Omega-3 Fatty Acids (FISH  OIL PO), Take 1 capsule by mouth daily., Disp: , Rfl:    pantoprazole (PROTONIX) 40 MG tablet, Take 40 mg by mouth daily., Disp: , Rfl:     Physical Exam: There were no vitals taken for this visit.   Affect appropriate Healthy:  appears stated age 50: normal Neck supple with no adenopathy JVP normal no bruits no thyromegaly Lungs clear with no wheezing and good diaphragmatic motion Heart:  S1/S2 no murmur, no rub, gallop or click PMI normal Abdomen: benighn, BS positve, no tenderness, no AAA no bruit.  No HSM or HJR Distal pulses intact with no bruits No edema Neuro non-focal Skin warm and dry No muscular weakness   Labs:   Lab Results  Component Value Date   WBC 7.3 11/04/2020   HGB 15.5 11/04/2020   HCT 46.4 11/04/2020   MCV 90.6 11/04/2020   PLT 286 11/04/2020   No results for input(s): NA, K, CL, CO2, BUN, CREATININE, CALCIUM, PROT,  BILITOT, ALKPHOS, ALT, AST, GLUCOSE in the last 168 hours.  Invalid input(s): LABALBU No results found for: CKTOTAL, CKMB, CKMBINDEX, TROPONINI No results found for: CHOL No results found for: HDL No results found for: LDLCALC No results found for: TRIG No results found for: CHOLHDL No results found for: LDLDIRECT    Radiology: No results found.  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

## 2020-12-10 ENCOUNTER — Encounter: Payer: Self-pay | Admitting: Cardiovascular Disease

## 2020-12-10 ENCOUNTER — Other Ambulatory Visit: Payer: Self-pay

## 2020-12-10 ENCOUNTER — Ambulatory Visit: Payer: Managed Care, Other (non HMO) | Admitting: Cardiovascular Disease

## 2020-12-10 VITALS — BP 130/98 | HR 78 | Ht 70.0 in | Wt 205.0 lb

## 2020-12-10 DIAGNOSIS — R079 Chest pain, unspecified: Secondary | ICD-10-CM

## 2020-12-10 DIAGNOSIS — E782 Mixed hyperlipidemia: Secondary | ICD-10-CM

## 2020-12-10 DIAGNOSIS — I10 Essential (primary) hypertension: Secondary | ICD-10-CM | POA: Diagnosis not present

## 2020-12-10 MED ORDER — METOPROLOL TARTRATE 100 MG PO TABS: 100.0000 mg | ORAL_TABLET | ORAL | 0 refills | Status: DC

## 2020-12-10 NOTE — Patient Instructions (Signed)
Medication Instructions:  Your physician recommends that you continue on your current medications as directed. Please refer to the Current Medication list given to you today.  Take Lopressor 100 mg 2 Hours prior to CT Scan.  *If you need a refill on your cardiac medications before your next appointment, please call your pharmacy*   Lab Work: Your physician recommends that you return for lab work 1 week prior to CT Scan.   If you have labs (blood work) drawn today and your tests are completely normal, you will receive your results only by: [place] (if you have MyChart) OR A paper copy in the mail If you have any lab test that is abnormal or we need to change your treatment, we will call you to review the results.   Testing/Procedures: Cardiac CTA    Follow-Up: At [facility], you and your health needs are our priority.  As part of our continuing mission to provide you with exceptional heart care, we have created designated Provider Care Teams.  These Care Teams include your primary Cardiologist (physician) and Advanced Practice Providers (APPs -  Physician Assistants and Nurse Practitioners) who all work together to provide you with the care you need, when you need it.  We recommend signing up for the patient portal called "MyChart".  Sign up information is provided on this After Visit Summary.  MyChart is used to connect with patients for Virtual Visits (Telemedicine).  Patients are able to view lab/test results, encounter notes, upcoming appointments, etc.  Non-urgent messages can be sent to your provider as well.   To learn more about what you can do with MyChart, go to [email or web address].    Your next appointment:    As Needed   The format for your next appointment:   In Person  Provider:   Jenkins Rouge, MD   Other Instructions Thank you for choosing [facility]!

## 2020-12-12 ENCOUNTER — Telehealth (HOSPITAL_COMMUNITY): Payer: Self-pay | Admitting: Emergency Medicine

## 2020-12-12 NOTE — Telephone Encounter (Signed)
Attempted to call patient regarding upcoming cardiac CT appointment. Left message on voicemail with name and callback number Marchia Bond RN Navigator Cardiac Imaging [facility] [phone] Office [phone] Cell  Requested lab draw for BMP at [facility] / [place] [facility] / or [facility] [place]

## 2020-12-15 ENCOUNTER — Other Ambulatory Visit: Payer: Self-pay

## 2020-12-15 DIAGNOSIS — I10 Essential (primary) hypertension: Secondary | ICD-10-CM

## 2020-12-15 DIAGNOSIS — R079 Chest pain, unspecified: Secondary | ICD-10-CM

## 2020-12-17 ENCOUNTER — Telehealth: Payer: Self-pay

## 2020-12-17 ENCOUNTER — Ambulatory Visit (HOSPITAL_COMMUNITY)
Admission: RE | Admit: 2020-12-17 | Discharge: 2020-12-17 | Disposition: A | Discharge status: Home / Self Care | Payer: Managed Care, Other (non HMO) | Source: Ambulatory Visit | Attending: Cardiovascular Disease | Admitting: Cardiovascular Disease

## 2020-12-17 ENCOUNTER — Other Ambulatory Visit: Payer: Self-pay

## 2020-12-17 DIAGNOSIS — R079 Chest pain, unspecified: Secondary | ICD-10-CM

## 2020-12-17 MED ORDER — NITROGLYCERIN 0.4 MG SL SUBL
SUBLINGUAL_TABLET | SUBLINGUAL | Status: AC
  Administered 2020-12-17: 0.8 mg via SUBLINGUAL
  Filled 2020-12-17: qty 2

## 2020-12-17 MED ORDER — NITROGLYCERIN 0.4 MG SL SUBL: 0.8000 mg | SUBLINGUAL_TABLET | Freq: Once | SUBLINGUAL | Status: AC

## 2020-12-17 MED ORDER — IOHEXOL 350 MG/ML SOLN
100.0000 mL | Freq: Once | INTRAVENOUS | Status: AC | PRN
  Administered 2020-12-17: 100 mL via INTRAVENOUS

## 2020-12-17 NOTE — Telephone Encounter (Signed)
Pt notified and voiced understanding. Pt had no questions or concerns at this time. 

## 2020-12-17 NOTE — Telephone Encounter (Signed)
-----   Message from Josue Hector, MD sent at 12/17/2020  2:26 PM EDT ----- Just a little calcium in one artery no significant blockages overall good

## 2021-02-26 ENCOUNTER — Ambulatory Visit: Payer: Managed Care, Other (non HMO) | Admitting: General Surgery

## 2021-02-26 ENCOUNTER — Encounter: Payer: Self-pay | Admitting: General Surgery

## 2021-02-26 ENCOUNTER — Other Ambulatory Visit: Payer: Self-pay

## 2021-02-26 VITALS — BP 130/86 | HR 81 | Temp 98.7°F | Resp 14 | Ht 70.0 in | Wt 197.0 lb

## 2021-02-26 DIAGNOSIS — K603 Anal fistula: Secondary | ICD-10-CM | POA: Diagnosis not present

## 2021-02-26 NOTE — Progress Notes (Signed)
[facility] History and Physical   Chief Complaint   Follow-up     Shannon Ritter is a 50 y.o. male.  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  He has been having some chronic drainage in the area and still has some hypertrophic granulation from his prior incision and drainage site from his abscess. He is ready to get this fistula looked at and hopefully resolved. He had to cancel his prior procedure because of elevated Bps. This is improved now.   Past Medical History:  Diagnosis Date   Angina pectoris (HCC)    GERD (gastroesophageal reflux disease)    Hypertension     Past Surgical History:  Procedure Laterality Date   ANTERIOR CRUCIATE LIGAMENT REPAIR Right    CARDIAC CATHETERIZATION      History reviewed. No pertinent family history.  Social History   Tobacco Use   Smoking status: Some Days    Packs/day: 0.25    Years: 30.00    Pack years: 7.50    Types: Cigarettes   Smokeless tobacco: Never  Vaping Use   Vaping Use: Never used  Substance Use Topics   Alcohol use: Yes    Alcohol/week: 36.0 standard drinks    Types: 36 Cans of beer per week    Comment: 2-3 times week   Drug use: Never    Medications: I have reviewed the patient's current medications. Allergies as of 02/26/2021   No Known Allergies      Medication List        Accurate as of February 26, 2021 11:59 PM. If you have any questions, ask your nurse or doctor.          STOP taking these medications    amLODipine 5 MG tablet Commonly known as: NORVASC Stopped by: Virl Cagey, MD       TAKE these medications    ALPRAZolam 1 MG tablet Commonly known as: XANAX Take 1 mg by mouth at bedtime.   aspirin 81 MG tablet Take 81 mg by mouth daily.   atorvastatin 20 MG tablet Commonly known as: LIPITOR Take 20 mg by mouth daily.   cetirizine 10 MG tablet Commonly known as: ZYRTEC Take 10 mg by mouth  daily.   FISH OIL PO Take 1 capsule by mouth daily.   hydrochlorothiazide 25 MG tablet Commonly known as: HYDRODIURIL Take 25 mg by mouth daily.   losartan 100 MG tablet Commonly known as: COZAAR Take 100 mg by mouth daily.   metoprolol tartrate 100 MG tablet Commonly known as: LOPRESSOR Take 1 tablet (100 mg total) by mouth as directed. Take 2 Hours prior to CT Scan   MULTIVITAMIN ADULT PO Take 1 tablet by mouth daily.   pantoprazole 40 MG tablet Commonly known as: PROTONIX Take 40 mg by mouth daily.   VITAMIN C PO Take 1 tablet by mouth daily.         ROS:  A comprehensive review of systems was negative except for: Gastrointestinal: positive for perianal drainage and tissue, tenderness  Blood pressure 130/86, pulse 81, temperature 98.7 F (37.1 C), temperature source Other (Comment), resp. rate 14, height 5\' 10"  (1.778 m), weight 197 lb (89.4 kg), SpO2 97 %. Physical Exam Vitals reviewed.  Constitutional:      Appearance: Normal appearance.  HENT:     Head: Normocephalic.     Mouth/Throat:  Mouth: Mucous membranes are moist.  Eyes:     Extraocular Movements: Extraocular movements intact.  Cardiovascular:     Rate and Rhythm: Normal rate and regular rhythm.  Pulmonary:     Effort: Pulmonary effort is normal.     Breath sounds: Normal breath sounds.  Abdominal:     General: There is no distension.     Palpations: Abdomen is soft.     Tenderness: There is no abdominal tenderness.  Genitourinary:    Rectum: No external hemorrhoid.     Comments: Hypertrophic tissue on right of posterior midline at anal verge from prior I&D site, tender Musculoskeletal:        General: No swelling.     Cervical back: Normal range of motion.  Skin:    General: Skin is warm.  Neurological:     General: No focal deficit present.     Mental Status: He is alert and oriented to person, place, and time.  Psychiatric:        Mood and Affect: Mood normal.        Behavior:  Behavior normal.    Results: None    Assessment & Plan:  Shannon Ritter is a 49 y.o. 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   Discussed fistulotomy and risk of bleeding, infection, seton placement, due to muscular involvement and additional procedures, discussed incontinence risk if muscle is cut   All questions were answered to the satisfaction of the patient.    Virl Cagey 03/01/2021, 11:35 AM

## 2021-02-26 NOTE — Patient Instructions (Signed)
Anal Fistulotomy Anal fistulotomy is a surgical procedure to open and drain an anal fistula. An anal fistula is an abnormal tunnel that develops between the bowel and the skin near the outside of the anus, where stool (feces) comes out. During this procedure, the fistula is opened up and the contents are drained to promote healing. Tell a health care provider about: Any allergies you have. All medicines you are taking, including vitamins, herbs, eye drops, creams, and over-the-counter medicines. Any problems you or family members have had with anesthetic medicines. Any blood disorders you have. Any surgeries you have had. Any medical conditions you have. Any problems you have controlling your bowel movements (incontinence). Whether you are pregnant or may be pregnant. What are the risks? Generally, this is a safe procedure. However, problems may occur, including: Infection. Bleeding. Allergic reactions to medicines or dyes. Damage to nearby structures or organs. Incontinence or leaking of stool. Being unable to empty your bladder (urinary retention). Needing more surgery, if the fistula returns. What happens before the procedure? Medicines Ask your health care provider about: Changing or stopping your regular medicines. This is especially important if you are taking diabetes medicines or blood thinners. Taking medicines such as aspirin and ibuprofen. These medicines can thin your blood. Do not take these medicines unless your health care provider tells you to take them. Taking over-the-counter medicines, vitamins, herbs, and supplements. Surgery safety Ask your health care provider: How your surgery site will be marked. What steps will be taken to help prevent infection. These may include: Removing hair at the surgery site. Washing skin with a germ-killing soap. Taking antibiotic medicine. General instructions Do not use any products that contain nicotine or tobacco for at least 4  weeks before the procedure. These products include cigarettes, e-cigarettes, and chewing tobacco. If you need help quitting, ask your health care provider. You may have an exam or testing. You may have a blood or urine sample taken. You may be told to take a medicine that helps you have a bowel movement (laxative) or use an enema to clean your bowels before surgery. Plan to have someone take you home from the hospital or clinic. Plan to have a responsible adult care for you for at least 24 hours after you leave the hospital or clinic. This is important. What happens during the procedure? An IV will be inserted into one of your veins. You will be given one or more of the following: A medicine to help you relax (sedative). A medicine to numb the area (local anesthetic). A medicine to make you fall asleep (general anesthetic). Your surgeon will locate the internal opening of your fistula. An incision will be made in the fistula opening. The incision may extend into the muscles around the anus (sphincter muscles). The fistula will be cut open and drained. Gauze bandages (dressings) may be placed inside of the fistula. The procedure may vary among health care providers and hospitals. What happens after the procedure?  Your blood pressure, heart rate, breathing rate, and blood oxygen level will be monitored until you leave the hospital or clinic. Do not drive for 24 hours if you were given a sedative during your procedure. You may continue to receive fluids and medicines through an IV. You may have some bleeding from your incision. You may have to wear a pad to absorb blood. Summary Anal fistulotomy is a surgical procedure to open and drain an anal fistula, which is an abnormal tunnel that develops between the bowel  and the skin near the outside of the anus. Before the procedure, tell your health care provider about any medical problems you have or have had, including problems controlling bowel  movements. You will be told what to eat and drink before the procedure, and what medicines to change or stop. Follow these instructions carefully. This is a safe procedure. However, problems may occur, including incontinence or leaking of stool and being unable to empty your bladder (urinary retention). You may have some bleeding from your incision after the procedure. You may have to wear a pad to absorb blood. This information is not intended to replace advice given to you by your health care provider. Make sure you discuss any questions you have with your health care provider. Document Revised: 10/16/2018 Document Reviewed: 10/16/2018 Elsevier Patient Education  [place].

## 2021-03-01 ENCOUNTER — Encounter: Payer: Self-pay | Admitting: General Surgery

## 2021-03-01 NOTE — H&P (Signed)
[facility] History and Physical   Chief Complaint   Follow-up     Shannon Ritter is a 50 y.o. male.  HPI: Mr. 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  He has been having some chronic drainage in the area and still has some hypertrophic granulation from his prior incision and drainage site from his abscess. He is ready to get this fistula looked at and hopefully resolved. He had to cancel his prior procedure because of elevated Bps. This is improved now.   Past Medical History:  Diagnosis Date   Angina pectoris (HCC)    GERD (gastroesophageal reflux disease)    Hypertension     Past Surgical History:  Procedure Laterality Date   ANTERIOR CRUCIATE LIGAMENT REPAIR Right    CARDIAC CATHETERIZATION      History reviewed. No pertinent family history.  Social History   Tobacco Use   Smoking status: Some Days    Packs/day: 0.25    Years: 30.00    Pack years: 7.50    Types: Cigarettes   Smokeless tobacco: Never  Vaping Use   Vaping Use: Never used  Substance Use Topics   Alcohol use: Yes    Alcohol/week: 36.0 standard drinks    Types: 36 Cans of beer per week    Comment: 2-3 times week   Drug use: Never    Medications: I have reviewed the patient's current medications. Allergies as of 02/26/2021   No Known Allergies      Medication List        Accurate as of February 26, 2021 11:59 PM. If you have any questions, ask your nurse or doctor.          STOP taking these medications    amLODipine 5 MG tablet Commonly known as: NORVASC Stopped by: Virl Cagey, MD       TAKE these medications    ALPRAZolam 1 MG tablet Commonly known as: XANAX Take 1 mg by mouth at bedtime.   aspirin 81 MG tablet Take 81 mg by mouth daily.   atorvastatin 20 MG tablet Commonly known as: LIPITOR Take 20 mg by mouth daily.   cetirizine 10 MG tablet Commonly known as: ZYRTEC Take 10 mg by mouth  daily.   FISH OIL PO Take 1 capsule by mouth daily.   hydrochlorothiazide 25 MG tablet Commonly known as: HYDRODIURIL Take 25 mg by mouth daily.   losartan 100 MG tablet Commonly known as: COZAAR Take 100 mg by mouth daily.   metoprolol tartrate 100 MG tablet Commonly known as: LOPRESSOR Take 1 tablet (100 mg total) by mouth as directed. Take 2 Hours prior to CT Scan   MULTIVITAMIN ADULT PO Take 1 tablet by mouth daily.   pantoprazole 40 MG tablet Commonly known as: PROTONIX Take 40 mg by mouth daily.   VITAMIN C PO Take 1 tablet by mouth daily.         ROS:  A comprehensive review of systems was negative except for: Gastrointestinal: positive for perianal drainage and tissue, tenderness  Blood pressure 130/86, pulse 81, temperature 98.7 F (37.1 C), temperature source Other (Comment), resp. rate 14, height 5\' 10"  (1.778 m), weight 197 lb (89.4 kg), SpO2 97 %. Physical Exam Vitals reviewed.  Constitutional:      Appearance: Normal appearance.  HENT:     Head: Normocephalic.     Mouth/Throat:  Mouth: Mucous membranes are moist.  Eyes:     Extraocular Movements: Extraocular movements intact.  Cardiovascular:     Rate and Rhythm: Normal rate and regular rhythm.  Pulmonary:     Effort: Pulmonary effort is normal.     Breath sounds: Normal breath sounds.  Abdominal:     General: There is no distension.     Palpations: Abdomen is soft.     Tenderness: There is no abdominal tenderness.  Genitourinary:    Rectum: No external hemorrhoid.     Comments: Hypertrophic tissue on right of posterior midline at anal verge from prior I&D site, tender Musculoskeletal:        General: No swelling.     Cervical back: Normal range of motion.  Skin:    General: Skin is warm.  Neurological:     General: No focal deficit present.     Mental Status: He is alert and oriented to person, place, and time.  Psychiatric:        Mood and Affect: Mood normal.        Behavior:  Behavior normal.    Results: None    Assessment & Plan:  [name] is a 50 y.o. 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   Discussed fistulotomy and risk of bleeding, infection, seton placement, due to muscular involvement and additional procedures, discussed incontinence risk if muscle is cut   All questions were answered to the satisfaction of the patient.    Virl Cagey 03/01/2021, 11:35 AM

## 2021-03-12 NOTE — Patient Instructions (Signed)
Shannon Ritter  03/12/2021     @PREFPERIOPPHARMACY @   Your procedure is scheduled on 03/16/2021.   Report to [name] at  0730  A.M.   Call this number if you have problems the morning of surgery:  [phone]   Remember:  Do not eat or drink after midnight.      Take these medicines the morning of surgery with A SIP OF WATER                   zyrtec, metoprolol, protonix.     Do not wear jewelry, make-up or nail polish.  Do not wear lotions, powders, or perfumes, or deodorant.  Do not shave 48 hours prior to surgery.  Men may shave face and neck.  Do not bring valuables to the hospital.  [facility] is not responsible for any belongings or valuables.  Contacts, dentures or bridgework may not be worn into surgery.  Leave your suitcase in the car.  After surgery it may be brought to your room.  For patients admitted to the hospital, discharge time will be determined by your treatment team.  Patients discharged the day of surgery will not be allowed to drive home and must have someone with them for 24 hours.    Special instructions:   DO NOT smoke tobacco or vape for 24 hours before your procedure.  Please read over the following fact sheets that you were given. Coughing and Deep Breathing, Surgical Site Infection Prevention, Anesthesia Post-op Instructions, and Care and Recovery After Surgery      Anal Fistulotomy, Care After This sheet gives you information about how to care for yourself after your procedure. Your health care provider may also give you more specific instructions. If you have problems or questions, contact your health care provider. What can I expect after the procedure? After the procedure, it is common to have: Some pain, discomfort, and swelling. Increased pain during bowel movements. Some bleeding from the incision area. Some leakage of stool. Follow these instructions at home: Medicines Take over-the-counter and prescription  medicines only as told by your health care provider. If you were prescribed an antibiotic medicine, take it as told by your health care provider. Do not stop taking the antibiotic even if you start to feel better. Ask your health care provider if the medicine prescribed to you requires you to avoid driving or using heavy machinery. Incision care  Follow instructions from your health care provider about how to take care of your incision. Make sure you: Wash your hands with soap and water before and after you remove your gauze (dressing). If soap and water are not available, use hand sanitizer. Remove your dressing as told by your health care provider. In some cases, you may be told not to remove the dressing, but to allow it to come out with your first bowel movement after surgery. Leave stitches (sutures), skin glue, or adhesive strips in place. These skin closures may need to stay in place for 2 weeks or longer. Do not remove adhesive strips completely unless your health care provider tells you to do that. Keep the incision area clean and dry. Check your incision area every day for signs of infection. Check for: More redness, swelling, or pain. More fluid or blood. Warmth. Pus or a bad smell. Self-care After a bowel movement, clean the incision area using one of the following methods: Gently wipe with a moist towelette. Gently wipe with  mild soap and water. Take a shower. Take a sitz bath. This is a shallow, warm-water bath that attaches to the toilet bowl. You can also sit in a bathtub filled with warm water. Do not swim or use a hot tub until your health care provider approves. To reduce discomfort, you may apply ice to the incision area. To do this: Put ice in a plastic bag. Place a towel between your skin and the bag. Leave the ice on for 20 minutes, 2-3 times a day. Activity  Rest as told by your health care provider. Avoid sitting for a long time without moving. Get up to take  short walks every 1-2 hours. This is important to improve blood flow and breathing. Ask for help if you feel weak or unsteady. Do not drive for 24 hours if you were given a sedative during your procedure. Do not lift anything that is heavier than 10 lb (4.5 kg), or the limit that you are told, until your health care provider says that it is safe. Return to your normal activities as told by your health care provider. Ask your health care provider what activities are safe for you. Eating and drinking Follow instructions from your health care provider about eating or drinking restrictions. You may need to take these actions to prevent or treat constipation: Drink enough fluid to keep your urine pale yellow. Eat foods that are high in fiber, such as beans, whole grains, and fresh fruits and vegetables. Limit foods that are high in fat and processed sugars, such as fried or sweet foods. Lifestyle Do not use any products that contain nicotine or tobacco, such as cigarettes, e-cigarettes, and chewing tobacco. If you need help quitting, ask your health care provider. Do not drink alcohol if: Your health care provider tells you not to drink. You are pregnant, may be pregnant, or are planning to become pregnant. If you drink alcohol: Limit how much you use to: 0-1 drink a day for women. 0-2 drinks a day for men. Be aware of how much alcohol is in your drink. In the [place] one drink equals one 12 oz bottle of beer (355 mL), one 5 oz glass of wine (148 mL), or one 1 oz glass of hard liquor (44 mL). General instructions If you have bleeding from the incision area, wear a pad to absorb blood. Change it often. Keep all follow-up visits as told by your health care provider. This is important. Contact a health care provider if: You have any of these signs of infection: More redness, swelling, or pain around your incision area. Warmth coming from your incision area, or your incision area is firm. Pus or a  bad smell coming from your incision area. A fever or chills. You develop swelling or tenderness in your groin area. You cannot control your bowel movements (incontinence), or you are leaking stool. You have trouble urinating. You have pain that does not get better with medicine. Get help right away if: You have severe pain in your abdomen or incision area. You have sudden chest pain. You become weak or you faint. You have more fluid or blood coming from your incision. You have bleeding from your incision that soaks 2 or more pads during 24 hours. Summary After anal fistulotomy, it is common to have increased pain during bowel movements and some bleeding. If a dressing was placed in your incision during surgery, remove it as told by your health care provider. It is important to keep the incision  area clean and dry after each bowel movement. If you have bleeding from the incision area, wear a pad to absorb blood. Change it often. This information is not intended to replace advice given to you by your health care provider. Make sure you discuss any questions you have with your health care provider. Document Revised: 10/16/2018 Document Reviewed: 10/16/2018 Elsevier Patient Education  2022 [employer]. How to Use Chlorhexidine for Bathing Chlorhexidine gluconate (CHG) is a germ-killing (antiseptic) solution that is used to clean the skin. It can get rid of the bacteria that normally live on the skin and can keep them away for about 24 hours. To clean your skin with CHG, you may be given: A CHG solution to use in the shower or as part of a sponge bath. A prepackaged cloth that contains CHG. Cleaning your skin with CHG may help lower the risk for infection: While you are staying in the intensive care unit of the hospital. If you have a vascular access, such as a central line, to provide short-term or long-term access to your veins. If you have a catheter to drain urine from your bladder. If  you are on a ventilator. A ventilator is a machine that helps you breathe by moving air in and out of your lungs. After surgery. What are the risks? Risks of using CHG include: A skin reaction. Hearing loss, if CHG gets in your ears and you have a perforated eardrum. Eye injury, if CHG gets in your eyes and is not rinsed out. The CHG product catching fire. Make sure that you avoid smoking and flames after applying CHG to your skin. Do not use CHG: If you have a chlorhexidine allergy or have previously reacted to chlorhexidine. On babies younger than 42 months of age. How to use CHG solution Use CHG only as told by your health care provider, and follow the instructions on the label. Use the full amount of CHG as directed. Usually, this is one bottle. During a shower Follow these steps when using CHG solution during a shower (unless your health care provider gives you different instructions): Start the shower. Use your normal soap and shampoo to wash your face and hair. Turn off the shower or move out of the shower stream. Pour the CHG onto a clean washcloth. Do not use any type of brush or rough-edged sponge. Starting at your neck, lather your body down to your toes. Make sure you follow these instructions: If you will be having surgery, pay special attention to the part of your body where you will be having surgery. Scrub this area for at least 1 minute. Do not use CHG on your head or face. If the solution gets into your ears or eyes, rinse them well with water. Avoid your genital area. Avoid any areas of skin that have broken skin, cuts, or scrapes. Scrub your back and under your arms. Make sure to wash skin folds. Let the lather sit on your skin for 1-2 minutes or as long as told by your health care provider. Thoroughly rinse your entire body in the shower. Make sure that all body creases and crevices are rinsed well. Dry off with a clean towel. Do not put any substances on your body  afterward--such as powder, lotion, or perfume--unless you are told to do so by your health care provider. Only use lotions that are recommended by the manufacturer. Put on clean clothes or pajamas. If it is the night before your surgery, sleep in clean  sheets.  During a sponge bath Follow these steps when using CHG solution during a sponge bath (unless your health care provider gives you different instructions): Use your normal soap and shampoo to wash your face and hair. Pour the CHG onto a clean washcloth. Starting at your neck, lather your body down to your toes. Make sure you follow these instructions: If you will be having surgery, pay special attention to the part of your body where you will be having surgery. Scrub this area for at least 1 minute. Do not use CHG on your head or face. If the solution gets into your ears or eyes, rinse them well with water. Avoid your genital area. Avoid any areas of skin that have broken skin, cuts, or scrapes. Scrub your back and under your arms. Make sure to wash skin folds. Let the lather sit on your skin for 1-2 minutes or as long as told by your health care provider. Using a different clean, wet washcloth, thoroughly rinse your entire body. Make sure that all body creases and crevices are rinsed well. Dry off with a clean towel. Do not put any substances on your body afterward--such as powder, lotion, or perfume--unless you are told to do so by your health care provider. Only use lotions that are recommended by the manufacturer. Put on clean clothes or pajamas. If it is the night before your surgery, sleep in clean sheets. How to use CHG prepackaged cloths Only use CHG cloths as told by your health care provider, and follow the instructions on the label. Use the CHG cloth on clean, dry skin. Do not use the CHG cloth on your head or face unless your health care provider tells you to. When washing with the CHG cloth: Avoid your genital area. Avoid  any areas of skin that have broken skin, cuts, or scrapes. Before surgery Follow these steps when using a CHG cloth to clean before surgery (unless your health care provider gives you different instructions): Using the CHG cloth, vigorously scrub the part of your body where you will be having surgery. Scrub using a back-and-forth motion for 3 minutes. The area on your body should be completely wet with CHG when you are done scrubbing. Do not rinse. Discard the cloth and let the area air-dry. Do not put any substances on the area afterward, such as powder, lotion, or perfume. Put on clean clothes or pajamas. If it is the night before your surgery, sleep in clean sheets.  For general bathing Follow these steps when using CHG cloths for general bathing (unless your health care provider gives you different instructions). Use a separate CHG cloth for each area of your body. Make sure you wash between any folds of skin and between your fingers and toes. Wash your body in the following order, switching to a new cloth after each step: The front of your neck, shoulders, and chest. Both of your arms, under your arms, and your hands. Your stomach and groin area, avoiding the genitals. Your right leg and foot. Your left leg and foot. The back of your neck, your back, and your buttocks. Do not rinse. Discard the cloth and let the area air-dry. Do not put any substances on your body afterward--such as powder, lotion, or perfume--unless you are told to do so by your health care provider. Only use lotions that are recommended by the manufacturer. Put on clean clothes or pajamas. Contact a health care provider if: Your skin gets irritated after scrubbing. You have  questions about using your solution or cloth. You swallow any chlorhexidine. Call your local poison control center (1-301-651-7760 in the [place] Get help right away if: Your eyes itch badly, or they become very red or swollen. Your skin itches  badly and is red or swollen. Your hearing changes. You have trouble seeing. You have swelling or tingling in your mouth or throat. You have trouble breathing. These symptoms may represent a serious problem that is an emergency. Do not wait to see if the symptoms will go away. Get medical help right away. Call your local emergency services (911 in the [place] Do not drive yourself to the hospital. Summary Chlorhexidine gluconate (CHG) is a germ-killing (antiseptic) solution that is used to clean the skin. Cleaning your skin with CHG may help to lower your risk for infection. You may be given CHG to use for bathing. It may be in a bottle or in a prepackaged cloth to use on your skin. Carefully follow your health care provider's instructions and the instructions on the product label. Do not use CHG if you have a chlorhexidine allergy. Contact your health care provider if your skin gets irritated after scrubbing. This information is not intended to replace advice given to you by your health care provider. Make sure you discuss any questions you have with your health care provider. Document Revised: 07/14/2020 Document Reviewed: 07/14/2020 Elsevier Patient Education  2022 [employer].

## 2021-03-13 ENCOUNTER — Encounter (HOSPITAL_COMMUNITY)
Admission: RE | Admit: 2021-03-13 | Discharge: 2021-03-13 | Disposition: A | Discharge status: Home / Self Care | Payer: Managed Care, Other (non HMO) | Source: Ambulatory Visit | Attending: General Surgery | Admitting: General Surgery

## 2021-03-13 VITALS — BP 157/91 | HR 80 | Temp 98.6°F | Resp 16 | Ht 70.0 in | Wt 197.0 lb

## 2021-03-13 DIAGNOSIS — Z01812 Encounter for preprocedural laboratory examination: Secondary | ICD-10-CM | POA: Diagnosis not present

## 2021-03-13 DIAGNOSIS — Z79899 Other long term (current) drug therapy: Secondary | ICD-10-CM | POA: Diagnosis not present

## 2021-03-13 LAB — BASIC METABOLIC PANEL
Anion gap: 9 (ref 5–15)
BUN: 21 mg/dL — ABNORMAL HIGH (ref 6–20)
CO2: 25 mmol/L (ref 22–32)
Calcium: 9.5 mg/dL (ref 8.9–10.3)
Chloride: 103 mmol/L (ref 98–111)
Creatinine, Ser: 0.76 mg/dL (ref 0.61–1.24)
GFR, Estimated: >60 mL/min (ref >60)
Glucose, Bld: 97 mg/dL (ref 70–99)
Potassium: 3.8 mmol/L (ref 3.5–5.1)
Sodium: 137 mmol/L (ref 135–145)

## 2021-03-16 ENCOUNTER — Encounter (HOSPITAL_COMMUNITY)
Admission: RE | Disposition: A | Discharge status: Home / Self Care | Payer: Self-pay | Source: Home / Self Care | Attending: General Surgery

## 2021-03-16 ENCOUNTER — Ambulatory Visit (HOSPITAL_COMMUNITY): Payer: Managed Care, Other (non HMO) | Admitting: Anesthesiology

## 2021-03-16 ENCOUNTER — Encounter (HOSPITAL_COMMUNITY): Payer: Self-pay | Admitting: General Surgery

## 2021-03-16 ENCOUNTER — Ambulatory Visit (HOSPITAL_COMMUNITY)
Admission: RE | Admit: 2021-03-16 | Discharge: 2021-03-16 | Disposition: A | Discharge status: Home / Self Care | Payer: Managed Care, Other (non HMO) | Attending: General Surgery | Admitting: General Surgery

## 2021-03-16 DIAGNOSIS — Z7982 Long term (current) use of aspirin: Secondary | ICD-10-CM | POA: Insufficient documentation

## 2021-03-16 DIAGNOSIS — K603 Anal fistula, unspecified: Secondary | ICD-10-CM | POA: Diagnosis present

## 2021-03-16 DIAGNOSIS — Z79899 Other long term (current) drug therapy: Secondary | ICD-10-CM | POA: Diagnosis not present

## 2021-03-16 DIAGNOSIS — F17210 Nicotine dependence, cigarettes, uncomplicated: Secondary | ICD-10-CM | POA: Diagnosis not present

## 2021-03-16 HISTORY — PX: FISTULOTOMY: SHX6413

## 2021-03-16 SURGERY — FISTULOTOMY
Anesthesia: General

## 2021-03-16 MED ORDER — LIDOCAINE HCL (CARDIAC) PF 100 MG/5ML IV SOSY
PREFILLED_SYRINGE | INTRAVENOUS | Status: DC | PRN
  Administered 2021-03-16: 50 mg via INTRAVENOUS

## 2021-03-16 MED ORDER — BUPIVACAINE LIPOSOME 1.3 % IJ SUSP
INTRAMUSCULAR | Status: DC | PRN
  Administered 2021-03-16: 10 mL

## 2021-03-16 MED ORDER — LACTATED RINGERS IV SOLN: INTRAVENOUS | Status: DC | PRN

## 2021-03-16 MED ORDER — METHYLENE BLUE 0.5 % INJ SOLN
INTRAVENOUS | Status: AC
  Filled 2021-03-16: qty 10

## 2021-03-16 MED ORDER — ONDANSETRON HCL 4 MG/2ML IJ SOLN
INTRAMUSCULAR | Status: AC
  Filled 2021-03-16: qty 2

## 2021-03-16 MED ORDER — DEXMEDETOMIDINE (PRECEDEX) IN NS 20 MCG/5ML (4 MCG/ML) IV SYRINGE
PREFILLED_SYRINGE | INTRAVENOUS | Status: DC | PRN
  Administered 2021-03-16: 20 ug via INTRAVENOUS

## 2021-03-16 MED ORDER — SODIUM CHLORIDE 0.9 % IR SOLN
Status: DC | PRN
  Administered 2021-03-16: 1000 mL

## 2021-03-16 MED ORDER — KETOROLAC TROMETHAMINE 30 MG/ML IJ SOLN: 30.0000 mg | Freq: Once | INTRAMUSCULAR | Status: DC

## 2021-03-16 MED ORDER — BUPIVACAINE LIPOSOME 1.3 % IJ SUSP
INTRAMUSCULAR | Status: AC
  Filled 2021-03-16: qty 20

## 2021-03-16 MED ORDER — PROPOFOL 10 MG/ML IV BOLUS
INTRAVENOUS | Status: AC
  Filled 2021-03-16: qty 20

## 2021-03-16 MED ORDER — FENTANYL CITRATE (PF) 100 MCG/2ML IJ SOLN
INTRAMUSCULAR | Status: AC
  Filled 2021-03-16: qty 2

## 2021-03-16 MED ORDER — OXYCODONE HCL 5 MG PO TABS: 5.0000 mg | ORAL_TABLET | ORAL | 0 refills | Status: DC | PRN

## 2021-03-16 MED ORDER — DEXAMETHASONE SODIUM PHOSPHATE 10 MG/ML IJ SOLN
INTRAMUSCULAR | Status: AC
  Filled 2021-03-16: qty 1

## 2021-03-16 MED ORDER — CHLORHEXIDINE GLUCONATE CLOTH 2 % EX PADS: 6.0000 | MEDICATED_PAD | Freq: Once | CUTANEOUS | Status: DC

## 2021-03-16 MED ORDER — DEXAMETHASONE SODIUM PHOSPHATE 10 MG/ML IJ SOLN
INTRAMUSCULAR | Status: DC | PRN
  Administered 2021-03-16: 10 mg via INTRAVENOUS

## 2021-03-16 MED ORDER — ONDANSETRON HCL 4 MG/2ML IJ SOLN: 4.0000 mg | Freq: Once | INTRAMUSCULAR | Status: DC | PRN

## 2021-03-16 MED ORDER — PROPOFOL 10 MG/ML IV BOLUS
INTRAVENOUS | Status: DC | PRN
  Administered 2021-03-16: 200 mg via INTRAVENOUS

## 2021-03-16 MED ORDER — ONDANSETRON HCL 4 MG PO TABS: 4.0000 mg | ORAL_TABLET | Freq: Three times a day (TID) | ORAL | 1 refills | Status: DC | PRN

## 2021-03-16 MED ORDER — METHYLENE BLUE 0.5 % INJ SOLN
INTRAVENOUS | Status: DC | PRN
  Administered 2021-03-16: 5 mL via SUBMUCOSAL

## 2021-03-16 MED ORDER — CHLORHEXIDINE GLUCONATE 0.12 % MT SOLN
OROMUCOSAL | Status: AC
  Filled 2021-03-16: qty 15

## 2021-03-16 MED ORDER — HEMOSTATIC AGENTS (NO CHARGE) OPTIME
TOPICAL | Status: DC | PRN
Start: 2021-03-16 — End: 2021-03-16
  Administered 2021-03-16: 1 via TOPICAL

## 2021-03-16 MED ORDER — LIDOCAINE VISCOUS HCL 2 % MT SOLN
OROMUCOSAL | Status: AC
  Filled 2021-03-16: qty 15

## 2021-03-16 MED ORDER — MIDAZOLAM HCL 2 MG/2ML IJ SOLN
INTRAMUSCULAR | Status: AC
  Filled 2021-03-16: qty 2

## 2021-03-16 MED ORDER — DEXMEDETOMIDINE (PRECEDEX) IN NS 20 MCG/5ML (4 MCG/ML) IV SYRINGE
PREFILLED_SYRINGE | INTRAVENOUS | Status: AC
  Filled 2021-03-16: qty 5

## 2021-03-16 MED ORDER — FENTANYL CITRATE PF 50 MCG/ML IJ SOSY
25.0000 ug | PREFILLED_SYRINGE | INTRAMUSCULAR | Status: DC | PRN
  Administered 2021-03-16: 50 ug via INTRAVENOUS
  Filled 2021-03-16: qty 1

## 2021-03-16 MED ORDER — MIDAZOLAM HCL 5 MG/5ML IJ SOLN
INTRAMUSCULAR | Status: DC | PRN
  Administered 2021-03-16: 2 mg via INTRAVENOUS

## 2021-03-16 MED ORDER — LIDOCAINE VISCOUS HCL 2 % MT SOLN
OROMUCOSAL | Status: DC | PRN
  Administered 2021-03-16: 1 via OROMUCOSAL

## 2021-03-16 MED ORDER — ONDANSETRON HCL 4 MG/2ML IJ SOLN
INTRAMUSCULAR | Status: DC | PRN
  Administered 2021-03-16: 4 mg via INTRAVENOUS

## 2021-03-16 MED ORDER — FENTANYL CITRATE (PF) 100 MCG/2ML IJ SOLN
INTRAMUSCULAR | Status: DC | PRN
  Administered 2021-03-16 (×2): 100 ug via INTRAVENOUS

## 2021-03-16 SURGICAL SUPPLY — 27 items
BAG HAMPER (MISCELLANEOUS) ×2 IMPLANT
CANNULA VESSEL 3MM 2 BLNT TIP (CANNULA) ×2 IMPLANT
CLOTH BEACON ORANGE TIMEOUT ST (SAFETY) ×2 IMPLANT
COVER LIGHT HANDLE STERIS (MISCELLANEOUS) ×4 IMPLANT
ELECT REM PT RETURN 9FT ADLT (ELECTROSURGICAL) ×2
ELECTRODE REM PT RTRN 9FT ADLT (ELECTROSURGICAL) ×1 IMPLANT
GAUZE SPONGE 4X4 12PLY STRL (GAUZE/BANDAGES/DRESSINGS) ×2 IMPLANT
GLOVE EUDERMIC 6.5 POWDERFREE (GLOVE) ×1 IMPLANT
GLOVE SURG UNDER POLY LF SZ6.5 (GLOVE) ×3 IMPLANT
GLOVE SURG UNDER POLY LF SZ7 (GLOVE) ×4 IMPLANT
GOWN STRL REUS W/TWL LRG LVL3 (GOWN DISPOSABLE) ×4 IMPLANT
HEMOSTAT SURGICEL 4X8 (HEMOSTASIS) ×2 IMPLANT
KIT TURNOVER CYSTO (KITS) ×2 IMPLANT
MANIFOLD NEPTUNE II (INSTRUMENTS) ×2 IMPLANT
NDL HYPO 21X1.5 SAFETY (NEEDLE) ×1 IMPLANT
NEEDLE HYPO 21X1.5 SAFETY (NEEDLE) ×2 IMPLANT
NS IRRIG 1000ML POUR BTL (IV SOLUTION) ×2 IMPLANT
PACK PERI GYN (CUSTOM PROCEDURE TRAY) ×2 IMPLANT
PAD ARMBOARD 7.5X6 YLW CONV (MISCELLANEOUS) ×2 IMPLANT
SET BASIN LINEN APH (SET/KITS/TRAYS/PACK) ×2 IMPLANT
SPONGE SURGIFOAM ABS GEL 100 (HEMOSTASIS) ×1 IMPLANT
SURGILUBE 2OZ TUBE FLIPTOP (MISCELLANEOUS) ×2 IMPLANT
SUT SILK 0 FSL (SUTURE) ×2 IMPLANT
SUT VIC AB 2-0 CT2 27 (SUTURE) IMPLANT
SYR 20ML LL LF (SYRINGE) ×4 IMPLANT
SYR 30ML LL (SYRINGE) ×2 IMPLANT
VESSEL LOOPS MAXI RED (MISCELLANEOUS) IMPLANT

## 2021-03-16 NOTE — Interval H&P Note (Signed)
History and Physical Interval Note:  03/16/2021 8:38 AM  Shannon Ritter  has presented today for surgery, with the diagnosis of Fistula in ano.  The various methods of treatment have been discussed with the patient and family. After consideration of risks, benefits and other options for treatment, the patient has consented to  Procedure(s): FISTULOTOMY VERSUS SETON (N/A) as a surgical intervention.  The patient's history has been reviewed, patient examined, no change in status, stable for surgery.  I have reviewed the patient's chart and labs.  Questions were answered to the patient's satisfaction.     Virl Cagey

## 2021-03-16 NOTE — Progress Notes (Signed)
[facility]  Updated wife. Fistulotomy performed. Will see next week. Rx to [facility].   Curlene Labrum, MD [facility] [address] [place], [place] [address] [phone] (office)

## 2021-03-16 NOTE — Op Note (Addendum)
[facility] Operative Note  03/16/21  Preoperative Diagnosis: Fistula in ano    Postoperative Diagnosis: Fistula in ano, superficial    Procedure(s) Performed: Exam under anesthesia, fistulotomy    Surgeon: Lanell Matar. Constance Haw, MD   Assistants: No qualified resident was available    Anesthesia: General endotracheal   Anesthesiologist: Louann Sjogren, MD    Specimens: None    Estimated Blood Loss: Minimal   Blood Replacement: None    Complications: None   Wound Class: Dirty    Operative Indications: Mr. Hiemstra is a 50 yo with a prior right posterior abscess and signs of fistula in ano. We discussed exam under anesthesia and possible fistulotomy versus seton. Discussed risk of bleeding, infection, injury to sphincter, need for seton and further surgeries.   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 General endotracheal anesthesia was induced. Intravenous antibiotics were administered per protocol.  He was then placed in lithotomy with all pressure points padded. The perineal area and anal area were  prepared and draped in the usual sterile fashion.   An external exam revealed a hard area of granulation tissue from his prior incision and drainage in the right posterior region. When this was manipulated some white discharge was noted. An internal exam with anoscope demonstrated a hardened area internally. I probed this with a lacrimal duct probe and then used a 22 gauge Angiocath and injected methylene blue. The fistula track was noted. This was superficial in nature and involved no muscle. The lacrimal probe as re-inserted. Using cautery the fistula was opened up and the fistula track was excised. No other fistulas or branching points were noted. The granulation tissue externally was excised. The area was made hemostatic. He was noted to have some internal hemorrhoids but were left  alone.   A gel foam and surgicel were soaked in viscous lidocaine and a silk suture was used to tag the gel foam. This was inserted in the anal region and will be removed in PACU. Mesh panty and an ABD was placed.   Final inspection revealed acceptable hemostasis. All counts were correct at the end of the case. The patient was awakened from anesthesia and extubated without complication.  The patient went to the PACU in stable condition.   Curlene Labrum, MD [facility] [address] [place], [place] [address] [phone] (office)

## 2021-03-16 NOTE — Anesthesia Preprocedure Evaluation (Signed)
Anesthesia Evaluation  Patient identified by MRN, date of birth, ID band Patient awake    Reviewed: Allergy & Precautions, H&P , NPO status , Patient's Chart, lab work & pertinent test results, reviewed documented beta blocker date and time   Airway Mallampati: II  TM Distance: >3 FB Neck ROM: full    Dental no notable dental hx.    Pulmonary neg pulmonary ROS, Current Smoker,    Pulmonary exam normal breath sounds clear to auscultation       Cardiovascular Exercise Tolerance: Good hypertension, negative cardio ROS   Rhythm:regular Rate:Normal     Neuro/Psych negative neurological ROS  negative psych ROS   GI/Hepatic Neg liver ROS, GERD  Medicated,  Endo/Other  negative endocrine ROS  Renal/GU negative Renal ROS  negative genitourinary   Musculoskeletal   Abdominal   Peds  Hematology negative hematology ROS (+)   Anesthesia Other Findings   Reproductive/Obstetrics negative OB ROS                             Anesthesia Physical Anesthesia Plan  ASA: 2  Anesthesia Plan: General   Post-op Pain Management:    Induction:   PONV Risk Score and Plan:   Airway Management Planned:   Additional Equipment:   Intra-op Plan:   Post-operative Plan:   Informed Consent: I have reviewed the patients History and Physical, chart, labs and discussed the procedure including the risks, benefits and alternatives for the proposed anesthesia with the patient or authorized representative who has indicated his/her understanding and acceptance.     Dental Advisory Given  Plan Discussed with: CRNA  Anesthesia Plan Comments:         Anesthesia Quick Evaluation

## 2021-03-16 NOTE — Anesthesia Procedure Notes (Signed)
Procedure Name: LMA Insertion Date/Time: 03/16/2021 9:09 AM Performed by: Jonna Munro, CRNA Pre-anesthesia Checklist: Patient identified, Emergency Drugs available, Suction available, Patient being monitored and Timeout performed Patient Re-evaluated:Patient Re-evaluated prior to induction Oxygen Delivery Method: Circle system utilized Preoxygenation: Pre-oxygenation with 100% oxygen Induction Type: IV induction LMA: LMA inserted LMA Size: 4.0 Number of attempts: 1 Placement Confirmation: positive ETCO2 and breath sounds checked- equal and bilateral Tube secured with: Tape Dental Injury: Teeth and Oropharynx as per pre-operative assessment

## 2021-03-16 NOTE — Discharge Instructions (Addendum)
Anal Fistulotomy Discharge Instructions:   Activity and diet as tolerated.   Care Instructions: You can resume your normal diet once you have sufficiently recovered from anesthesia. You should drink lots of liquid. Water is best. Try to drink at least 6-8 glasses of liquid daily.  Medications: It is very important to prevent constipation after surgery. You should take a stool softener, such as docusate sodium (Colace), twice a day for the first two weeks. Also take a fiber supplement such as Benefiber, Citrucel, or Metamucil, twice a day every day. Consult your pharmacist if you need help. If your stools become loose, you can stop taking the softeners.  Take tylenol and ibuprofen as needed for pain control, alternating every 4-6 hours.  Example:  Tylenol 1000mg  @ 6am, 12noon, 6pm, 76midnight (Do not exceed 4000mg  of tylenol a day).  Ibuprofen 800mg  @ 9am, 3pm, 9pm, 3am (Do not exceed 3600mg  of ibuprofen a day).  Take Roxicodone for breakthrough pain every 4 hours.  Drink plenty of water to also prevent constipation.  Do not drive, operate machinery, or drink alcohol when taking narcotic pain medication.  Within a few days, your pain should be sufficiently controlled with medications such as ibuprofen or acetaminophen.  Dressing: You can replace your pad as needed after surgery. Feminine pads work best.  It is normal to see some bloody drainage on the dressing.  Bleeding should not be heavy or continuous.   Hygiene: Keep the surgical area clean by taking Sitz baths (or tub baths) several times per day. These are helpful for cleaning after each bowel movement. Frequent showering is an alternative to baths, although many patients find baths soothing after surgery.  Equipment for [employer] can be purchased at [employer] or medical/surgical supply stores. o Use warm (not hot) water for cleansing.  Sitting on a pillow or ice pack may also provide relief. Do not apply  ice directly to the skin, use a towel or pillow case as a buffer. We do not encourage sitting on an inflatable ring ("doughnut") as this leads to unopposed downward pressure in the anal area.  What symptoms should I expect?  It is normal to have pain for a few weeks. Thereafter, you may notice discomfort with prolonged sitting and certain activities.    Pain Expectations and Narcotics: -After surgery you will have pain associated with your surgery and this is normal. The pain is muscular and nerve pain, and will get better with time. -You are encouraged and expected to take non narcotic medications like tylenol and ibuprofen (when able) to treat pain as multiple modalities can aid with pain treatment. -Narcotics are only used when pain is severe or there is breakthrough pain. -You are not expected to have a pain score of 0 after surgery, as we cannot prevent pain. A pain score of 3-4 that allows you to be functional, move, walk, and tolerate some activity is the goal. The pain will continue to improve over the days after surgery and is dependent on your surgery. -Due to [place] law, we are only able to give a certain amount of pain medication to treat post operative pain, and we only give additional narcotics on a patient by patient basis.  -For most laparoscopic surgery, studies have shown that the majority of patients only need 10-15 narcotic pills, and for open surgeries or anal surgeries most patients only need 15-20.   -Having appropriate expectations of pain and knowledge of pain management with non narcotics is important as we  do not want anyone to become addicted to narcotic pain medication.  -Doing your Sitz baths will help with pain and pressure discomfort.  -Simple acts like meditation and mindfulness practices after surgery can also help with pain control and research has proven the benefit of these practices.  Contact Information: If you have questions or concerns, please call our  office, [phone], Monday- Thursday 8AM-5PM and Friday 8AM-12Noon.  If it is after hours or on the weekend, please call [facility], [phone], [phone], and ask to speak to the surgeon on call for Dr. Constance Haw at [facility].       PATIENT INSTRUCTIONS POST-ANESTHESIA  IMMEDIATELY FOLLOWING SURGERY:  Do not drive or operate machinery for the first twenty four hours after surgery.  Do not make any important decisions for twenty four hours after surgery or while taking narcotic pain medications or sedatives.  If you develop intractable nausea and vomiting or a severe headache please notify your doctor immediately.  FOLLOW-UP:  Please make an appointment with your surgeon as instructed. You do not need to follow up with anesthesia unless specifically instructed to do so.  WOUND CARE INSTRUCTIONS (if applicable):  Keep a dry clean dressing on the anesthesia/puncture wound site if there is drainage.  Once the wound has quit draining you may leave it open to air.  Generally you should leave the bandage intact for twenty four hours unless there is drainage.  If the epidural site drains for more than 36-48 hours please call the anesthesia department.  QUESTIONS?:  Please feel free to call your physician or the hospital operator if you have any questions, and they will be happy to assist you.

## 2021-03-16 NOTE — Transfer of Care (Signed)
Immediate Anesthesia Transfer of Care Note  Patient: Shannon Ritter  Procedure(s) Performed: FISTULOTOMY VERSUS SETON  Patient Location: PACU  Anesthesia Type:General  Level of Consciousness: awake, alert , oriented and patient cooperative  Airway & Oxygen Therapy: Patient Spontanous Breathing and Patient connected to face mask oxygen  Post-op Assessment: Report given to RN, Post -op Vital signs reviewed and stable and Patient moving all extremities X 4  Post vital signs: Reviewed and stable  Last Vitals:  Vitals Value Taken Time  BP 124/99 03/16/21 1000  Temp 36.6 C 03/16/21 0955  Pulse 93 03/16/21 1006  Resp 14 03/16/21 1006  SpO2 92 % 03/16/21 1006  Vitals shown include unvalidated device data.  Last Pain:  Vitals:   03/16/21 0955  TempSrc:   PainSc: 0-No pain      Patients Stated Pain Goal: 8 (06/81/66 1969)  Complications: No notable events documented.

## 2021-03-16 NOTE — Anesthesia Postprocedure Evaluation (Signed)
Anesthesia Post Note  Patient: Shannon Ritter  Procedure(s) Performed: FISTULOTOMY VERSUS SETON  Patient location during evaluation: Phase II Anesthesia Type: General Level of consciousness: awake Pain management: pain level controlled Vital Signs Assessment: post-procedure vital signs reviewed and stable Respiratory status: spontaneous breathing and respiratory function stable Cardiovascular status: blood pressure returned to baseline and stable Postop Assessment: no headache and no apparent nausea or vomiting Anesthetic complications: no Comments: Late entry   No notable events documented.   Last Vitals:  Vitals:   03/16/21 1031 03/16/21 1038  BP: (!) 144/106 (!) 133/95  Pulse: 80   Resp: 16   Temp: 36.4 C   SpO2: 98%     Last Pain:  Vitals:   03/16/21 1031  TempSrc: Oral  PainSc: [place]

## 2021-03-17 ENCOUNTER — Encounter (HOSPITAL_COMMUNITY): Payer: Self-pay | Admitting: General Surgery

## 2021-03-23 ENCOUNTER — Telehealth: Payer: Self-pay | Admitting: Family Medicine

## 2021-03-23 NOTE — Telephone Encounter (Signed)
FMLA paperwork completed and faxed to [employer] at [phone] and to [employer] at [phone] and received confirmation for both.   Out of work starting 03/10/2021 with TBD RTW date.

## 2021-03-24 ENCOUNTER — Encounter: Payer: Self-pay | Admitting: General Surgery

## 2021-03-24 ENCOUNTER — Other Ambulatory Visit: Payer: Self-pay

## 2021-03-24 ENCOUNTER — Ambulatory Visit (INDEPENDENT_AMBULATORY_CARE_PROVIDER_SITE_OTHER): Payer: Managed Care, Other (non HMO) | Admitting: General Surgery

## 2021-03-24 VITALS — BP 126/86 | HR 87 | Temp 98.0°F | Resp 16 | Ht 70.0 in | Wt 202.0 lb

## 2021-03-24 DIAGNOSIS — K603 Anal fistula: Secondary | ICD-10-CM

## 2021-03-24 MED ORDER — OXYCODONE HCL 5 MG PO TABS: 5.0000 mg | ORAL_TABLET | ORAL | 0 refills | Status: DC | PRN

## 2021-03-24 NOTE — Patient Instructions (Signed)
Continue sitz baths. Keep stools regular and soft, using stool softener and miralax if needed.  Can take roxicodone 5-10mg  every 4 hours as needed for pain. Tylenol and ibuprofen for pain otherwise

## 2021-03-24 NOTE — Progress Notes (Signed)
[facility]  Having drainage but otherwise doing well. He has some soreness with sitting.  Says it is sore when he has a BM. He did have one harder BM. He is taking stool softeners.  BP 126/86   Pulse 87   Temp 98 F (36.7 C) (Other (Comment))   Resp 16   Ht 5\' 10"  (1.778 m)   Wt 202 lb (91.6 kg)   SpO2 96%   BMI 28.98 kg/m  Healing fistulotomy site, no redness, minor drainage  Patient s/p fistulotomy. Overall doing well. FMLA from work for now given discomfort and sitz bath needs.  Continue sitz baths. Keep stools regular and soft, using stool softener and miralax if needed.  Can take roxicodone 5-10mg  every 4 hours as needed for pain. Tylenol and ibuprofen for pain otherwise   Future Appointments  Date Time Provider [place]  04/07/2021 10:45 AM Virl Cagey, MD [facility] None   Curlene Labrum, MD [facility] [address] [place], [place] [address] [phone] (office)

## 2021-04-07 ENCOUNTER — Other Ambulatory Visit: Payer: Self-pay

## 2021-04-07 ENCOUNTER — Encounter: Payer: Self-pay | Admitting: General Surgery

## 2021-04-07 ENCOUNTER — Ambulatory Visit (INDEPENDENT_AMBULATORY_CARE_PROVIDER_SITE_OTHER): Payer: Managed Care, Other (non HMO) | Admitting: General Surgery

## 2021-04-07 ENCOUNTER — Encounter: Payer: Self-pay | Admitting: Family Medicine

## 2021-04-07 VITALS — BP 128/92 | HR 78 | Temp 97.9°F | Resp 16 | Ht 70.0 in | Wt 206.0 lb

## 2021-04-07 DIAGNOSIS — K603 Anal fistula: Secondary | ICD-10-CM

## 2021-04-07 NOTE — Progress Notes (Signed)
[facility]  Minor drainage. Having some soreness with prolonged sitting.   BP (!) 128/92   Pulse 78   Temp 97.9 F (36.6 C) (Other (Comment))   Resp 16   Ht 5\' 10"  (1.778 m)   Wt 206 lb (93.4 kg)   SpO2 96%   BMI 29.56 kg/m  Healing fistulotomy site posteriorly.  No signs of infection  Patient s/p fistolotomy, healing but still with pain and soreness.  Continue sitz baths and keeping area clean. Continue keeping stools soft. Will see you on 12/6 and tentatively plan to return to work 12/7.  Future Appointments  Date Time Provider [place]  04/21/2021 11:30 AM Virl Cagey, MD [facility] None   Curlene Labrum, MD [facility] [address] [place], [place] [address] [phone] (office)

## 2021-04-07 NOTE — Patient Instructions (Signed)
Continue sitz baths and keeping area clean. Continue keeping stools soft. Will see you on 12/6 and tentatively plan to return to work 12/7.

## 2021-04-21 ENCOUNTER — Other Ambulatory Visit: Payer: Self-pay | Admitting: Family Medicine

## 2021-04-21 ENCOUNTER — Other Ambulatory Visit: Payer: Self-pay

## 2021-04-21 ENCOUNTER — Encounter: Payer: Self-pay | Admitting: General Surgery

## 2021-04-21 ENCOUNTER — Ambulatory Visit (INDEPENDENT_AMBULATORY_CARE_PROVIDER_SITE_OTHER): Payer: Managed Care, Other (non HMO) | Admitting: General Surgery

## 2021-04-21 VITALS — BP 126/83 | HR 74 | Temp 97.1°F | Resp 14 | Ht 70.0 in | Wt 205.0 lb

## 2021-04-21 DIAGNOSIS — K603 Anal fistula: Secondary | ICD-10-CM

## 2021-04-21 NOTE — Patient Instructions (Signed)
Sitz baths as needed Keep stools regular and soft  Return to work 05/19/2020

## 2021-04-21 NOTE — Progress Notes (Signed)
[facility]  Still with drainage and issues sitting for long periods of time. Is feeling like things are improving.   He had surgery about 5 weeks ago.   BP 126/83   Pulse 74   Temp (!) 97.1 F (36.2 C) (Other (Comment))   Resp 14   Ht 5\' 10"  (1.778 m)   Wt 205 lb (93 kg)   SpO2 97%   BMI 29.41 kg/m  Fistulotomy site healing, no signs of infection Tender appropriately  Patient s/p fistulotomy and still healing. Has issues sitting for regular periods of time and still needing to keep clean/ sitz baths after Bms.   Will plan for him to return to work 05/19/2020 without restrictions as this should give him the full 8 weeks to recover and be able to sit for longer periods of time and be healed to not need sitz baths etc.  Will see back PRN.   Curlene Labrum, MD [facility] [address] [place], [place] [address] [phone] (office)

## 2021-10-06 IMAGING — CT CT HEART MORP W/ CTA COR W/ SCORE W/ CA W/CM &/OR W/O CM
4 of 7 series · 8 of 20 positions shown, 9 images · IV contrast (APPLIED)
Comparison: None.
COMPARISON: None.

Addendum:
EXAM:
OVER-READ INTERPRETATION  CT CHEST

The following report is an over-read performed by radiologist Dr.
over-read does not include interpretation of cardiac or coronary
anatomy or pathology. The coronary CTA interpretation by the
cardiologist is attached.
CLINICAL DATA: Chest pain
Cardiac/Coronary CTA
TECHNIQUE: A non-contrast, gated CT scan was obtained with axial slices of 3 mm
through the heart for calcium scoring. Calcium scoring was performed
using the Agatston method. A 110 kV prospective, gated, contrast
cardiac scan was obtained. Gantry rotation speed was 250 msecs and
collimation was 0.6 mm. Two sublingual nitroglycerin tablets (0.8
mg) were given. The 3D data set was reconstructed in 5% intervals of
the 35-75% of the R-R cycle. Diastolic phases were analyzed on a
dedicated workstation using MPR, MIP, and VRT modes. The patient
received 95 cc of contrast.

[Series 6: best diast 71 % · axial · 0.40mm/px · z∈[-253,-208]mm · 2 of 336 slices shown]
[im 112/336  vessel]
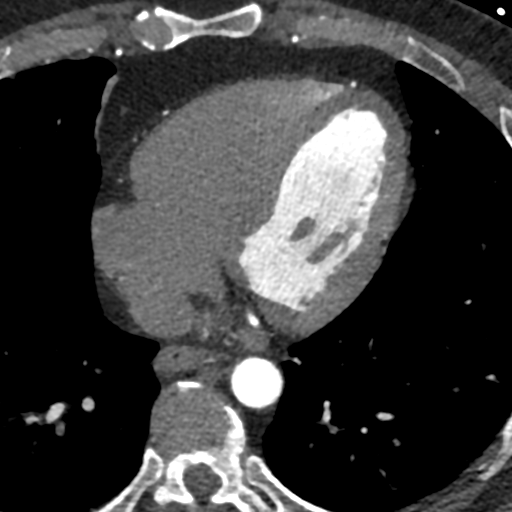
[im 224/336  vessel]
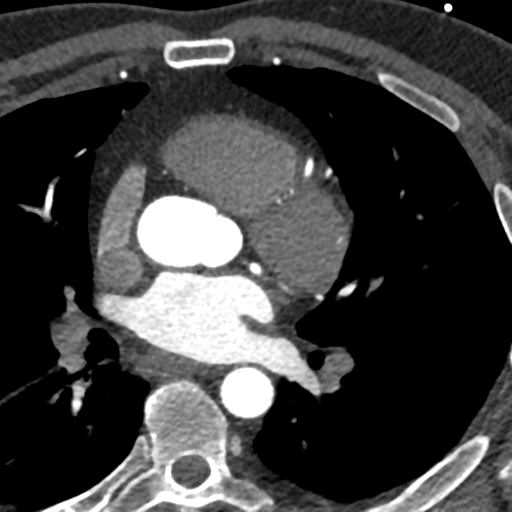

[Series 7: best syst · axial · 0.40mm/px · z∈[-253,-208]mm · 2 of 336 slices shown, 3 images]
[im 112/336  vessel]
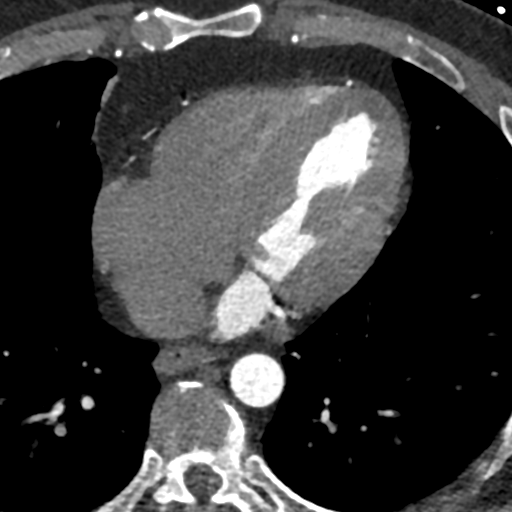
[im 112/336  lung]
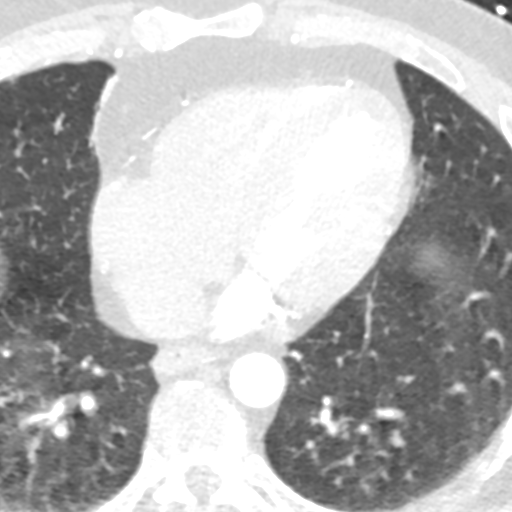
[im 224/336  vessel]
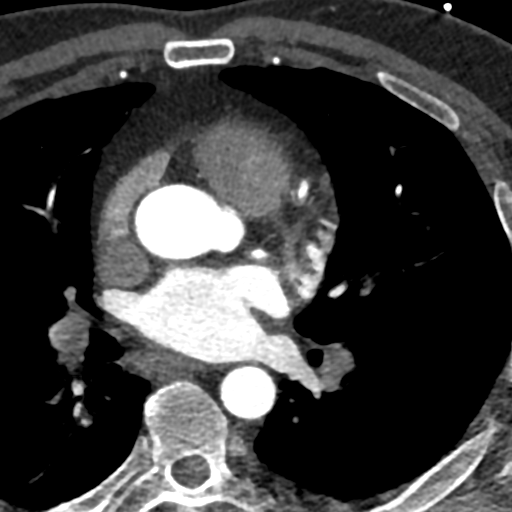

[Series 8: ts diast sharp 71 % · axial · 0.40mm/px · z∈[-253,-208]mm · 2 of 336 slices shown]
[im 112/336  lung]
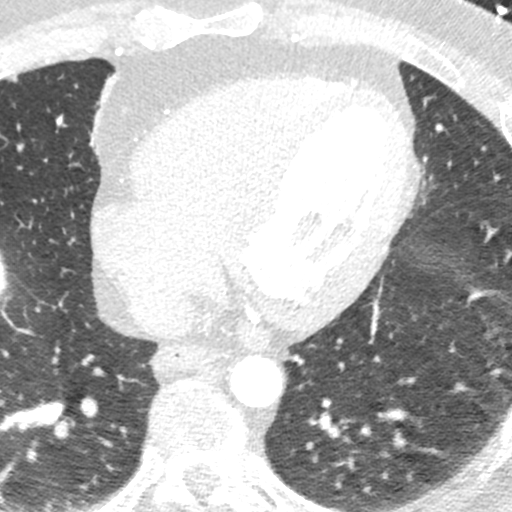
[im 224/336  lung]
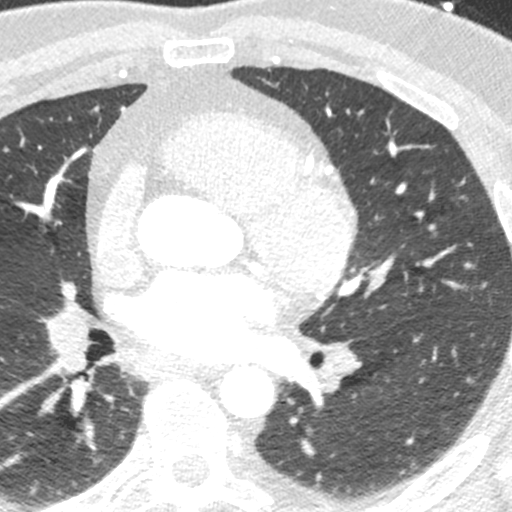

[Series 9: ts syst sharp 36 % · axial · 0.40mm/px · z∈[-253,-208]mm · 2 of 336 slices shown]
[im 112/336  lung]
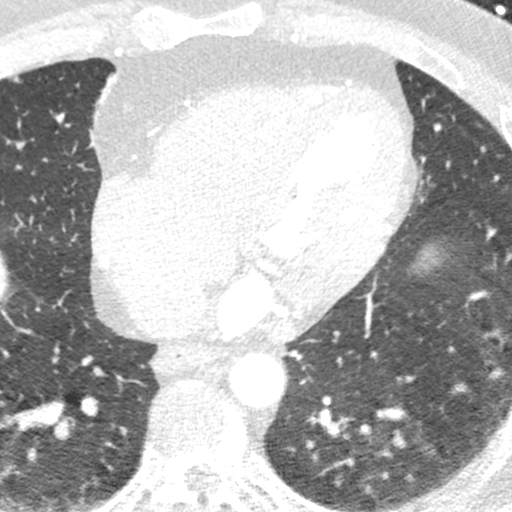
[im 224/336  lung]
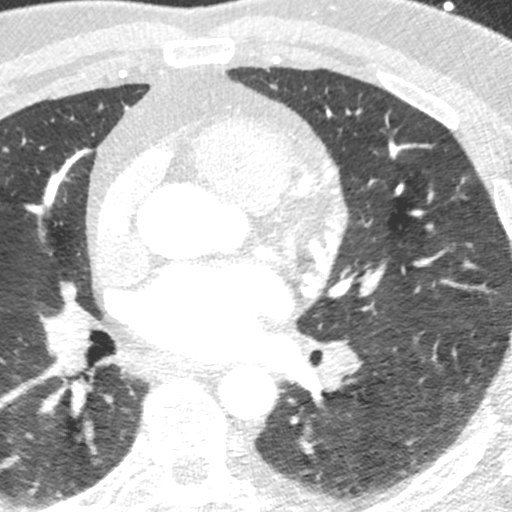

[8 of 20 positions shown; findings below may reference images not displayed]

FINDINGS: Vascular: No significant noncardiac vascular findings.

Mediastinum/Nodes: Visualized mediastinum and hilar regions
demonstrate no lymphadenopathy or masses.

Lungs/Pleura: Visualized lungs show no evidence of pulmonary edema,
consolidation, pneumothorax, nodule or pleural fluid.

Upper Abdomen: No acute abnormality.

Musculoskeletal: No chest wall mass or suspicious bone lesions
identified.
IMPRESSION: No significant incidental findings.
FINDINGS: Image quality: Excellent.

Noise artifact is: Limited.

Coronary Arteries:  Normal coronary origin.  Left dominance.

Left main: The left main is a large caliber vessel with a normal
take off from the left coronary cusp that bifurcates to form a left
anterior descending artery and a left circumflex artery. There is no
plaque or stenosis.

Left anterior descending artery: The proximal LAD contains minimal
mixed density plaque (<25%). The LAD gives off 2 patent diagonal
branches.

Left circumflex artery: The LCX is dominant and patent with no
evidence of plaque or stenosis. The LCX gives off 3 patent obtuse
marginal branches. The LCX terminates as a PDA without evidence of
plaque or stenosis.

Right coronary artery: The RCA is non-dominant with normal take off
from the right coronary cusp. There is no evidence of plaque or
stenosis.

Right Atrium: Right atrial size is within normal limits.

Right Ventricle: The right ventricular cavity is within normal
limits.

Left Atrium: Left atrial size is normal in size with no left atrial
appendage filling defect.

Left Ventricle: The ventricular cavity size is within normal limits.
There are no stigmata of prior infarction. There is no abnormal
filling defect.

Pulmonary arteries: Normal in size without proximal filling defect.

Pulmonary veins: Normal pulmonary venous drainage.

Pericardium: Normal thickness with no significant effusion or
calcium present.

Cardiac valves: The aortic valve is trileaflet without significant
calcification. The mitral valve is normal structure without
significant calcification.

Aorta: Normal caliber with no significant disease.

Extra-cardiac findings: See attached radiology report for
non-cardiac structures.
IMPRESSION: 1. Coronary calcium score of 2.2. This was 71st percentile for age-,
sex, and race-matched controls.

2. Normal coronary origin with left dominance.

3. Minimal mixed density plaque (<25%) in the proximal LAD.

RECOMMENDATIONS:
1. Minimal non-obstructive CAD (0-24%). Consider non-atherosclerotic
causes of chest pain. Consider preventive therapy and risk factor
modification.

*** End of Addendum ***
EXAM:
OVER-READ INTERPRETATION  CT CHEST

The following report is an over-read performed by radiologist Dr.
over-read does not include interpretation of cardiac or coronary
anatomy or pathology. The coronary CTA interpretation by the
cardiologist is attached.
FINDINGS: Vascular: No significant noncardiac vascular findings.

Mediastinum/Nodes: Visualized mediastinum and hilar regions
demonstrate no lymphadenopathy or masses.

Lungs/Pleura: Visualized lungs show no evidence of pulmonary edema,
consolidation, pneumothorax, nodule or pleural fluid.

Upper Abdomen: No acute abnormality.

Musculoskeletal: No chest wall mass or suspicious bone lesions
identified.
IMPRESSION: No significant incidental findings.

## 2021-11-27 ENCOUNTER — Encounter: Payer: Self-pay | Admitting: *Deleted

## 2021-12-17 ENCOUNTER — Encounter: Payer: Self-pay | Admitting: *Deleted

## 2021-12-17 NOTE — Patient Instructions (Signed)
  Procedure: colonoscopy Estimated body mass index is 28.12 kg/m as calculated from the following:   Height as of this encounter: '5\' 10"'$  (1.778 m).   Weight as of this encounter: 196 lb (88.9 kg).   Have you had a colonoscopy before?  8 yrs ago  Do you have family history of colon cancer  No  Do you have a family history of polyps? Yes  Previous colonoscopy with polyps removed? Yes, 8 yrs ago  Do you have a history colorectal cancer?   No  Are you diabetic?  No  Do you have a prosthetic or mechanical heart valve? No  Do you have a pacemaker/defibrillator?   No  Have you had endocarditis/atrial fibrillation?  No  Do you use supplemental oxygen/CPAP?  No  Have you had joint replacement within the last 12 months?  No  Do you tend to be constipated or have to use laxatives?  No   Do you have history of alcohol use? If yes, how much and how often.  Yes, 2 x week, 5th of [place] or 12 pack ultra  Do you have history or are you using drugs? If yes, what do are you  using?  No  Have you ever had a stroke/heart attack?  No  Have you ever had a heart or other vascular stent placed,?No  Do you take weight loss medication? No  Do you take any blood-thinning medications such as: (Plavix, aspirin, Coumadin, Aggrenox, Brilinta, Xarelto, Eliquis, Pradaxa, Savaysa or Effient) Yes  If yes we need the name, milligram, dosage and who is prescribing doctor: OTC aspirin 81 mg daily             Current Outpatient Medications  Medication Sig Dispense Refill   ALPRAZolam (XANAX) 1 MG tablet Take 1 mg by mouth at bedtime.     Ascorbic Acid (VITAMIN C PO) Take 1 tablet by mouth daily.     aspirin 81 MG tablet Take 81 mg by mouth daily.     atorvastatin (LIPITOR) 20 MG tablet Take 20 mg by mouth daily.      cetirizine (ZYRTEC) 10 MG tablet Take 10 mg by mouth daily.     hydrochlorothiazide (HYDRODIURIL) 25 MG tablet Take 25 mg by mouth daily.     losartan (COZAAR) 100 MG tablet Take 100 mg by  mouth daily.     Multiple Vitamins-Minerals (MULTIVITAMIN ADULT PO) Take 1 tablet by mouth daily.     Omega-3 Fatty Acids (FISH OIL PO) Take 1 capsule by mouth daily.     pantoprazole (PROTONIX) 40 MG tablet Take 40 mg by mouth daily.     No current facility-administered medications for this visit.    No Known Allergies

## 2021-12-28 ENCOUNTER — Encounter: Payer: Self-pay | Admitting: *Deleted

## 2021-12-28 MED ORDER — NA SULFATE-K SULFATE-MG SULF 17.5-3.13-1.6 GM/177ML PO SOLN: ORAL | 0 refills | Status: DC

## 2021-12-28 NOTE — Progress Notes (Signed)
Patient scheduled for 01/25/22 at 11 am. Instructions and pre-op appt will be mailed to pt. Prep sent to pharmacy

## 2021-12-28 NOTE — Progress Notes (Signed)
[facility] to schedule colonoscopy ASA 3

## 2021-12-29 ENCOUNTER — Encounter: Payer: Self-pay | Admitting: *Deleted

## 2022-01-20 NOTE — Patient Instructions (Signed)
Shannon Ritter  01/20/2022     '@PREFPERIOPPHARMACY'$ @   Your procedure is scheduled on  9/11/202.   Report to [facility] at  0900  A.M.   Call this number if you have problems the morning of surgery:  [phone]   Remember:  Follow the diet and prep instructions given to you by the office.     Take these medicines the morning of surgery with A SIP OF WATER                                                  zyrtec, protonix.     Do not wear jewelry, make-up or nail polish.  Do not wear lotions, powders, or perfumes, or deodorant.  Do not shave 48 hours prior to surgery.  Men may shave face and neck.  Do not bring valuables to the hospital.  [facility] is not responsible for any belongings or valuables.  Contacts, dentures or bridgework may not be worn into surgery.  Leave your suitcase in the car.  After surgery it may be brought to your room.  For patients admitted to the hospital, discharge time will be determined by your treatment team.  Patients discharged the day of surgery will not be allowed to drive home and must have someone with them for 24 hours.    Special instructions:   DO NOT smoke tobacco or vape for 24 hours before your procedure.  Please read over the following fact sheets that you were given. Anesthesia Post-op Instructions and Care and Recovery After Surgery      Colonoscopy, Adult, Care After The following information offers guidance on how to care for yourself after your procedure. Your health care provider may also give you more specific instructions. If you have problems or questions, contact your health care provider. What can I expect after the procedure? After the procedure, it is common to have: A small amount of blood in your stool for 24 hours after the procedure. Some gas. Mild cramping or bloating of your abdomen. Follow these instructions at home: Eating and drinking  Drink enough fluid to keep your urine pale yellow. Follow  instructions from your health care provider about eating or drinking restrictions. Resume your normal diet as told by your health care provider. Avoid heavy or fried foods that are hard to digest. Activity Rest as told by your health care provider. Avoid sitting for a long time without moving. Get up to take short walks every 1-2 hours. This is important to improve blood flow and breathing. Ask for help if you feel weak or unsteady. Return to your normal activities as told by your health care provider. Ask your health care provider what activities are safe for you. Managing cramping and bloating  Try walking around when you have cramps or feel bloated. If directed, apply heat to your abdomen as told by your health care provider. Use the heat source that your health care provider recommends, such as a moist heat pack or a heating pad. Place a towel between your skin and the heat source. Leave the heat on for 20-30 minutes. Remove the heat if your skin turns bright red. This is especially important if you are unable to feel pain, heat, or cold. You have a greater risk of getting burned. General instructions If  you were given a sedative during the procedure, it can affect you for several hours. Do not drive or operate machinery until your health care provider says that it is safe. For the first 24 hours after the procedure: Do not sign important documents. Do not drink alcohol. Do your regular daily activities at a slower pace than normal. Eat soft foods that are easy to digest. Take over-the-counter and prescription medicines only as told by your health care provider. Keep all follow-up visits. This is important. Contact a health care provider if: You have blood in your stool 2-3 days after the procedure. Get help right away if: You have more than a small spotting of blood in your stool. You have large blood clots in your stool. You have swelling of your abdomen. You have nausea or  vomiting. You have a fever. You have increasing pain in your abdomen that is not relieved with medicine. These symptoms may be an emergency. Get help right away. Call 911. Do not wait to see if the symptoms will go away. Do not drive yourself to the hospital. Summary After the procedure, it is common to have a small amount of blood in your stool. You may also have mild cramping and bloating of your abdomen. If you were given a sedative during the procedure, it can affect you for several hours. Do not drive or operate machinery until your health care provider says that it is safe. Get help right away if you have a lot of blood in your stool, nausea or vomiting, a fever, or increased pain in your abdomen. This information is not intended to replace advice given to you by your health care provider. Make sure you discuss any questions you have with your health care provider. Document Revised: 12/24/2020 Document Reviewed: 12/24/2020 Elsevier Patient Education  [place] After This sheet gives you information about how to care for yourself after your procedure. Your health care provider may also give you more specific instructions. If you have problems or questions, contact your health care provider. What can I expect after the procedure? After the procedure, it is common to have: Tiredness. Forgetfulness about what happened after the procedure. Impaired judgment for important decisions. Nausea or vomiting. Some difficulty with balance. Follow these instructions at home: For the time period you were told by your health care provider:     Rest as needed. Do not participate in activities where you could fall or become injured. Do not drive or use machinery. Do not drink alcohol. Do not take sleeping pills or medicines that cause drowsiness. Do not make important decisions or sign legal documents. Do not take care of children on your own. Eating and  drinking Follow the diet that is recommended by your health care provider. Drink enough fluid to keep your urine pale yellow. If you vomit: Drink water, juice, or soup when you can drink without vomiting. Make sure you have little or no nausea before eating solid foods. General instructions Have a responsible adult stay with you for the time you are told. It is important to have someone help care for you until you are awake and alert. Take over-the-counter and prescription medicines only as told by your health care provider. If you have sleep apnea, surgery and certain medicines can increase your risk for breathing problems. Follow instructions from your health care provider about wearing your sleep device: Anytime you are sleeping, including during daytime naps. While taking prescription pain medicines,  sleeping medicines, or medicines that make you drowsy. Avoid smoking. Keep all follow-up visits as told by your health care provider. This is important. Contact a health care provider if: You keep feeling nauseous or you keep vomiting. You feel light-headed. You are still sleepy or having trouble with balance after 24 hours. You develop a rash. You have a fever. You have redness or swelling around the IV site. Get help right away if: You have trouble breathing. You have new-onset confusion at home. Summary For several hours after your procedure, you may feel tired. You may also be forgetful and have poor judgment. Have a responsible adult stay with you for the time you are told. It is important to have someone help care for you until you are awake and alert. Rest as told. Do not drive or operate machinery. Do not drink alcohol or take sleeping pills. Get help right away if you have trouble breathing, or if you suddenly become confused. This information is not intended to replace advice given to you by your health care provider. Make sure you discuss any questions you have with your  health care provider. Document Revised: 04/07/2021 Document Reviewed: 04/05/2019 Elsevier Patient Education  [place].

## 2022-01-21 ENCOUNTER — Encounter (HOSPITAL_COMMUNITY)
Admission: RE | Admit: 2022-01-21 | Discharge: 2022-01-21 | Disposition: A | Discharge status: Home / Self Care | Payer: Managed Care, Other (non HMO) | Source: Ambulatory Visit | Attending: Internal Medicine | Admitting: Internal Medicine

## 2022-01-21 ENCOUNTER — Encounter (HOSPITAL_COMMUNITY): Payer: Self-pay

## 2022-01-21 ENCOUNTER — Other Ambulatory Visit: Payer: Self-pay

## 2022-01-21 ENCOUNTER — Other Ambulatory Visit: Payer: Self-pay | Admitting: *Deleted

## 2022-01-21 VITALS — BP 126/90 | HR 76 | Temp 97.8°F | Resp 18 | Ht 70.0 in | Wt 196.0 lb

## 2022-01-21 DIAGNOSIS — Z79899 Other long term (current) drug therapy: Secondary | ICD-10-CM | POA: Diagnosis not present

## 2022-01-21 DIAGNOSIS — I10 Essential (primary) hypertension: Secondary | ICD-10-CM | POA: Insufficient documentation

## 2022-01-21 DIAGNOSIS — Z01818 Encounter for other preprocedural examination: Secondary | ICD-10-CM | POA: Insufficient documentation

## 2022-01-21 LAB — BASIC METABOLIC PANEL
Anion gap: 10 (ref 5–15)
BUN: 36 mg/dL — ABNORMAL HIGH (ref 6–20)
CO2: 26 mmol/L (ref 22–32)
Calcium: 9.6 mg/dL (ref 8.9–10.3)
Chloride: 100 mmol/L (ref 98–111)
Creatinine, Ser: 1.52 mg/dL — ABNORMAL HIGH (ref 0.61–1.24)
GFR, Estimated: 56 mL/min — ABNORMAL LOW (ref >60)
Glucose, Bld: 90 mg/dL (ref 70–99)
Potassium: 3.5 mmol/L (ref 3.5–5.1)
Sodium: 136 mmol/L (ref 135–145)

## 2022-01-21 MED ORDER — NA SULFATE-K SULFATE-MG SULF 17.5-3.13-1.6 GM/177ML PO SOLN: ORAL | 0 refills | Status: DC

## 2022-01-25 ENCOUNTER — Ambulatory Visit (HOSPITAL_COMMUNITY)
Admission: RE | Admit: 2022-01-25 | Discharge: 2022-01-25 | Disposition: A | Discharge status: Home / Self Care | Payer: Managed Care, Other (non HMO) | Attending: Internal Medicine | Admitting: Internal Medicine

## 2022-01-25 ENCOUNTER — Ambulatory Visit (HOSPITAL_BASED_OUTPATIENT_CLINIC_OR_DEPARTMENT_OTHER): Payer: Managed Care, Other (non HMO) | Admitting: Anesthesiology

## 2022-01-25 ENCOUNTER — Encounter (HOSPITAL_COMMUNITY): Payer: Self-pay

## 2022-01-25 ENCOUNTER — Ambulatory Visit (HOSPITAL_COMMUNITY): Payer: Managed Care, Other (non HMO) | Admitting: Anesthesiology

## 2022-01-25 ENCOUNTER — Encounter (HOSPITAL_COMMUNITY)
Admission: RE | Disposition: A | Discharge status: Home / Self Care | Payer: Self-pay | Source: Home / Self Care | Attending: Internal Medicine

## 2022-01-25 ENCOUNTER — Other Ambulatory Visit: Payer: Self-pay

## 2022-01-25 DIAGNOSIS — K5730 Diverticulosis of large intestine without perforation or abscess without bleeding: Secondary | ICD-10-CM | POA: Diagnosis not present

## 2022-01-25 DIAGNOSIS — D120 Benign neoplasm of cecum: Secondary | ICD-10-CM

## 2022-01-25 DIAGNOSIS — F17210 Nicotine dependence, cigarettes, uncomplicated: Secondary | ICD-10-CM | POA: Insufficient documentation

## 2022-01-25 DIAGNOSIS — Z09 Encounter for follow-up examination after completed treatment for conditions other than malignant neoplasm: Secondary | ICD-10-CM | POA: Diagnosis not present

## 2022-01-25 DIAGNOSIS — K648 Other hemorrhoids: Secondary | ICD-10-CM | POA: Insufficient documentation

## 2022-01-25 DIAGNOSIS — Z86010 Personal history of colonic polyps: Secondary | ICD-10-CM

## 2022-01-25 DIAGNOSIS — K219 Gastro-esophageal reflux disease without esophagitis: Secondary | ICD-10-CM | POA: Insufficient documentation

## 2022-01-25 DIAGNOSIS — I10 Essential (primary) hypertension: Secondary | ICD-10-CM | POA: Diagnosis not present

## 2022-01-25 DIAGNOSIS — Z1211 Encounter for screening for malignant neoplasm of colon: Secondary | ICD-10-CM | POA: Diagnosis present

## 2022-01-25 DIAGNOSIS — K635 Polyp of colon: Secondary | ICD-10-CM

## 2022-01-25 DIAGNOSIS — Z79899 Other long term (current) drug therapy: Secondary | ICD-10-CM | POA: Diagnosis not present

## 2022-01-25 HISTORY — PX: COLONOSCOPY WITH PROPOFOL: SHX5780

## 2022-01-25 HISTORY — PX: POLYPECTOMY: SHX5525

## 2022-01-25 SURGERY — COLONOSCOPY WITH PROPOFOL
Anesthesia: General

## 2022-01-25 MED ORDER — PROPOFOL 10 MG/ML IV BOLUS
INTRAVENOUS | Status: DC | PRN
  Administered 2022-01-25: 100 mg via INTRAVENOUS
  Administered 2022-01-25 (×3): 50 mg via INTRAVENOUS
  Administered 2022-01-25: 30 mg via INTRAVENOUS
  Administered 2022-01-25: 50 mg via INTRAVENOUS

## 2022-01-25 MED ORDER — LACTATED RINGERS IV SOLN: INTRAVENOUS | Status: DC

## 2022-01-25 NOTE — Anesthesia Preprocedure Evaluation (Signed)
Anesthesia Evaluation  Patient identified by MRN, date of birth, ID band Patient awake    Reviewed: Allergy & Precautions, NPO status , Patient's Chart, lab work & pertinent test results  Airway Mallampati: II  TM Distance: >3 FB Neck ROM: Full    Dental  (+) Dental Advisory Given, Caps,    Pulmonary Current Smoker and Patient abstained from smoking.,    Pulmonary exam normal breath sounds clear to auscultation       Cardiovascular Exercise Tolerance: Good hypertension, Pt. on medications + angina Normal cardiovascular exam Rhythm:Regular Rate:Normal     Neuro/Psych negative neurological ROS  negative psych ROS   GI/Hepatic GERD  Medicated,(+)     substance abuse (drinks 6 to 12 drinks/day during weekends)  alcohol use,   Endo/Other  negative endocrine ROS  Renal/GU negative Renal ROS  negative genitourinary   Musculoskeletal negative musculoskeletal ROS (+)   Abdominal   Peds negative pediatric ROS (+)  Hematology negative hematology ROS (+)   Anesthesia Other Findings   Reproductive/Obstetrics negative OB ROS                             Anesthesia Physical Anesthesia Plan  ASA: 2  Anesthesia Plan: General   Post-op Pain Management: Minimal or no pain anticipated   Induction: Intravenous  PONV Risk Score and Plan: Propofol infusion  Airway Management Planned: Nasal Cannula and Natural Airway  Additional Equipment:   Intra-op Plan:   Post-operative Plan: Extubation in OR  Informed Consent: I have reviewed the patients History and Physical, chart, labs and discussed the procedure including the risks, benefits and alternatives for the proposed anesthesia with the patient or authorized representative who has indicated his/her understanding and acceptance.     Dental advisory given  Plan Discussed with: CRNA and Surgeon  Anesthesia Plan Comments:          Anesthesia Quick Evaluation

## 2022-01-25 NOTE — H&P (Signed)
Primary Care Physician:  Redmond School, MD Primary Gastroenterologist:  Dr. Abbey Chatters  Pre-Procedure History & Physical: HPI:  Shannon Ritter is a 51 y.o. male is here for a colonoscopy to be performed for surveillance purposes, personal history of adenomatous colon polyps 8 years ago.  Past Medical History:  Diagnosis Date   Angina pectoris ([place])    GERD (gastroesophageal reflux disease)    Hypertension     Past Surgical History:  Procedure Laterality Date   ANTERIOR CRUCIATE LIGAMENT REPAIR Right    CARDIAC CATHETERIZATION     FISTULOTOMY N/A 03/16/2021   Procedure: FISTULOTOMY VERSUS SETON;  Surgeon: Virl Cagey, MD;  Location: [facility];  Service: General;  Laterality: N/A;    Prior to Admission medications   Medication Sig Start Date End Date Taking? Authorizing Provider  ALPRAZolam Duanne Moron) 1 MG tablet Take 1 mg by mouth at bedtime. 04/10/16  Yes [provider]  Ascorbic Acid (VITAMIN C PO) Take 1 tablet by mouth daily.   Yes [provider]  aspirin 81 MG tablet Take 81 mg by mouth daily.   Yes [provider]  atorvastatin (LIPITOR) 20 MG tablet Take 20 mg by mouth daily.    Yes [provider]  cetirizine (ZYRTEC) 10 MG tablet Take 10 mg by mouth daily. 05/30/20  Yes [provider]  hydrochlorothiazide (HYDRODIURIL) 25 MG tablet Take 25 mg by mouth daily. 04/23/20  Yes [provider]  losartan (COZAAR) 100 MG tablet Take 100 mg by mouth daily. 11/12/20  Yes [provider]  Multiple Vitamins-Minerals (MULTIVITAMIN ADULT PO) Take 1 tablet by mouth daily.   Yes [provider]  Na Sulfate-K Sulfate-Mg Sulf 17.5-3.13-1.6 GM/177ML SOLN As directed 01/21/22  Yes Murel Wigle, Elon Alas, DO  Omega-3 Fatty Acids (FISH OIL PO) Take 1 capsule by mouth daily.   Yes [provider]  pantoprazole (PROTONIX) 40 MG tablet Take 40 mg by mouth daily. 04/05/16  Yes [provider]    Allergies as of 12/28/2021    (No Known Allergies)    History reviewed. No pertinent family history.  Social History   Socioeconomic History   Marital status: Married    Spouse name: Not on file   Number of children: Not on file   Years of education: Not on file   Highest education level: Not on file  Occupational History   Not on file  Tobacco Use   Smoking status: Some Days    Packs/day: 0.25    Years: 30.00    Total pack years: 7.50    Types: Cigarettes   Smokeless tobacco: Never  Vaping Use   Vaping Use: Never used  Substance and Sexual Activity   Alcohol use: Yes    Alcohol/week: 36.0 standard drinks of alcohol    Types: 36 Cans of beer per week    Comment: 2-3 times week   Drug use: Never   Sexual activity: Yes  Other Topics Concern   Not on file  Social History Narrative   Not on file   Social Determinants of Health   Financial Resource Strain: Not on file  Food Insecurity: Not on file  Transportation Needs: Not on file  Physical Activity: Not on file  Stress: Not on file  Social Connections: Not on file  Intimate Partner Violence: Not on file    Review of Systems: See HPI, otherwise negative ROS  Physical Exam: Vital signs in last 24 hours: Temp:  [98.8 F (37.1 C)] 98.8 F (  37.1 C) (09/11 0922) Pulse Rate:  [95] 95 (09/11 0922) Resp:  [21] 21 (09/11 0922) BP: (144)/(104) 144/104 (09/11 0922) SpO2:  [99 %] 99 % (09/11 0922) Weight:  [84.8 kg] 84.8 kg (09/11 0922)   General:   Alert,  Well-developed, well-nourished, pleasant and cooperative in NAD Head:  Normocephalic and atraumatic. Eyes:  Sclera clear, no icterus.   Conjunctiva pink. Ears:  Normal auditory acuity. Nose:  No deformity, discharge,  or lesions. Mouth:  No deformity or lesions, dentition normal. Neck:  Supple; no masses or thyromegaly. Lungs:  Clear throughout to auscultation.   No wheezes, crackles, or rhonchi. No acute distress. Heart:  Regular rate and rhythm; no murmurs, clicks, rubs,  or  gallops. Abdomen:  Soft, nontender and nondistended. No masses, hepatosplenomegaly or hernias noted. Normal bowel sounds, without guarding, and without rebound.   Msk:  Symmetrical without gross deformities. Normal posture. Extremities:  Without clubbing or edema. Neurologic:  Alert and  oriented x4;  grossly normal neurologically. Skin:  Intact without significant lesions or rashes. Cervical Nodes:  No significant cervical adenopathy. Psych:  Alert and cooperative. Normal mood and affect.  Impression/Plan: Shannon Ritter is here for a colonoscopy to be performed for surveillance purposes, personal history of adenomatous colon polyps 8 years ago.  The risks of the procedure including infection, bleed, or perforation as well as benefits, limitations, alternatives and imponderables have been reviewed with the patient. Questions have been answered. All parties agreeable.

## 2022-01-25 NOTE — Transfer of Care (Signed)
Immediate Anesthesia Transfer of Care Note  Patient: Shannon Ritter  Procedure(s) Performed: COLONOSCOPY WITH PROPOFOL POLYPECTOMY  Patient Location: Short Stay  Anesthesia Type:General  Level of Consciousness: awake and alert   Airway & Oxygen Therapy: Patient Spontanous Breathing  Post-op Assessment: Report given to RN and Post -op Vital signs reviewed and stable  Post vital signs: Reviewed and stable  Last Vitals:  Vitals Value Taken Time  BP 95/67 01/25/22 1031  Temp 36.8 C 01/25/22 1031  Pulse 100 01/25/22 1031  Resp 12 01/25/22 1031  SpO2 97 % 01/25/22 1031    Last Pain:  Vitals:   01/25/22 1031  TempSrc: Oral  PainSc: 0-No pain         Complications: No notable events documented.

## 2022-01-25 NOTE — Op Note (Signed)
[facility] Patient Name: Shannon Ritter Procedure Date: 01/25/2022 9:56 AM MRN: [id number] Date of Birth: 1970/10/16 Attending MD: Elon Alas. Abbey Chatters DO CSN: [id number] Age: 51 Admit Type: Outpatient Procedure:                Colonoscopy Indications:              Surveillance: Personal history of adenomatous                            polyps on last colonoscopy > 5 years ago Providers:                Elon Alas. Abbey Chatters, DO, Janeece Riggers, RN, Caprice Kluver,                            Kristine L. Risa Grill, Technician, Everardo Pacific Referring MD:              Medicines:                See the Anesthesia note for documentation of the                            administered medications Complications:            No immediate complications. Estimated Blood Loss:     Estimated blood loss was minimal. Procedure:                Pre-Anesthesia Assessment:                           - The anesthesia plan was to use monitored                            anesthesia care (MAC).                           After obtaining informed consent, the colonoscope                            was passed under direct vision. Throughout the                            procedure, the patient's blood pressure, pulse, and                            oxygen saturations were monitored continuously. The                            [id number]-HQ190L ([id number]) scope was introduced through                            the anus and advanced to the the cecum, identified                            by appendiceal orifice and ileocecal valve. The  colonoscopy was performed without difficulty. The                            patient tolerated the procedure well. The quality                            of the bowel preparation was evaluated using the                            BBPS [facility] Bowel Preparation Scale) with scores                            of: Right Colon = 2 (minor amount of residual                             staining, small fragments of stool and/or opaque                            liquid, but mucosa seen well), Transverse Colon = 2                            (minor amount of residual staining, small fragments                            of stool and/or opaque liquid, but mucosa seen                            well) and Left Colon = 2 (minor amount of residual                            staining, small fragments of stool and/or opaque                            liquid, but mucosa seen well). The total BBPS score                            equals 6. The quality of the bowel preparation was                            fair. Scope In: 10:12:18 AM Scope Out: 10:27:37 AM Scope Withdrawal Time: 0 hours 13 minutes 21 seconds  Total Procedure Duration: 0 hours 15 minutes 19 seconds  Findings:      The perianal and digital rectal examinations were normal.      Non-bleeding internal hemorrhoids were found during endoscopy.      A few small-mouthed diverticula were found in the sigmoid colon.      A 6 mm polyp was found in the cecum. The polyp was flat. The polyp was       removed with a cold snare. Resection and retrieval were complete.      The exam was otherwise without abnormality. Impression:               - Preparation of the colon was fair.                           -  Non-bleeding internal hemorrhoids.                           - Diverticulosis in the sigmoid colon.                           - One 6 mm polyp in the cecum, removed with a cold                            snare. Resected and retrieved.                           - The examination was otherwise normal. Moderate Sedation:      Per Anesthesia Care Recommendation:           - Patient has a contact number available for                            emergencies. The signs and symptoms of potential                            delayed complications were discussed with the                            patient. Return to normal activities  tomorrow.                            Written discharge instructions were provided to the                            patient.                           - Resume previous diet.                           - Continue present medications.                           - Await pathology results.                           - Repeat colonoscopy in 5 years for surveillance.                           - Return to GI clinic PRN. Procedure Code(s):        --- Professional ---                           [phone], Colonoscopy, flexible; with removal of                            tumor(s), polyp(s), or other lesion(s) by snare                            technique Diagnosis Code(s):        ---  Professional ---                           Z86.010, Personal history of colonic polyps                           K64.8, Other hemorrhoids                           K63.5, Polyp of colon                           K57.30, Diverticulosis of large intestine without                            perforation or abscess without bleeding CPT copyright 2019 American Medical Association. All rights reserved. The codes documented in this report are preliminary and upon coder review may  be revised to meet current compliance requirements. Elon Alas. Abbey Chatters, DO [place] Abbey Chatters, DO 01/25/2022 10:29:56 AM This report has been signed electronically. Number of Addenda: 0

## 2022-01-25 NOTE — Anesthesia Postprocedure Evaluation (Signed)
Anesthesia Post Note  Patient: Shannon Ritter  Procedure(s) Performed: COLONOSCOPY WITH PROPOFOL POLYPECTOMY  Patient location during evaluation: Phase II Anesthesia Type: General Level of consciousness: awake and alert and oriented Pain management: pain level controlled Vital Signs Assessment: post-procedure vital signs reviewed and stable Respiratory status: spontaneous breathing, nonlabored ventilation and respiratory function stable Cardiovascular status: blood pressure returned to baseline and stable Postop Assessment: no apparent nausea or vomiting Anesthetic complications: no   No notable events documented.   Last Vitals:  Vitals:   01/25/22 1031 01/25/22 1035  BP: 95/67 116/86  Pulse: 100   Resp: 12   Temp: 36.8 C   SpO2: 97%     Last Pain:  Vitals:   01/25/22 1031  TempSrc: Oral  PainSc: 0-No pain                 Madline Oesterling C Jaidin Ugarte

## 2022-01-25 NOTE — Discharge Instructions (Addendum)
  Colonoscopy Discharge Instructions  Read the instructions outlined below and refer to this sheet in the next few weeks. These discharge instructions provide you with general information on caring for yourself after you leave the hospital. Your doctor may also give you specific instructions. While your treatment has been planned according to the most current medical practices available, unavoidable complications occasionally occur.   ACTIVITY You may resume your regular activity, but move at a slower pace for the next 24 hours.  Take frequent rest periods for the next 24 hours.  Walking will help get rid of the air and reduce the bloated feeling in your belly (abdomen).  No driving for 24 hours (because of the medicine (anesthesia) used during the test).   Do not sign any important legal documents or operate any machinery for 24 hours (because of the anesthesia used during the test).  NUTRITION Drink plenty of fluids.  You may resume your normal diet as instructed by your doctor.  Begin with a light meal and progress to your normal diet. Heavy or fried foods are harder to digest and may make you feel sick to your stomach (nauseated).  Avoid alcoholic beverages for 24 hours or as instructed.  MEDICATIONS You may resume your normal medications unless your doctor tells you otherwise.  WHAT YOU CAN EXPECT TODAY Some feelings of bloating in the abdomen.  Passage of more gas than usual.  Spotting of blood in your stool or on the toilet paper.  IF YOU HAD POLYPS REMOVED DURING THE COLONOSCOPY: No aspirin products for 7 days or as instructed.  No alcohol for 7 days or as instructed.  Eat a soft diet for the next 24 hours.  FINDING OUT THE RESULTS OF YOUR TEST Not all test results are available during your visit. If your test results are not back during the visit, make an appointment with your caregiver to find out the results. Do not assume everything is normal if you have not heard from your  caregiver or the medical facility. It is important for you to follow up on all of your test results.  SEEK IMMEDIATE MEDICAL ATTENTION IF: You have more than a spotting of blood in your stool.  Your belly is swollen (abdominal distention).  You are nauseated or vomiting.  You have a temperature over 101.  You have abdominal pain or discomfort that is severe or gets worse throughout the day.   Your colonoscopy revealed 1 polyp(s) which I removed successfully. Await pathology results, my office will contact you. I recommend repeating colonoscopy in 5 years for surveillance purposes.   You also have diverticulosis and internal hemorrhoids. I would recommend increasing fiber in your diet or adding OTC Benefiber/Metamucil. Be sure to drink at least 4 to 6 glasses of water daily. Follow-up with GI as needed.   I hope you have a great rest of your week!  Charles K. Carver, D.O. Gastroenterology and Hepatology [facility]  

## 2022-01-26 LAB — SURGICAL PATHOLOGY

## 2022-02-01 ENCOUNTER — Encounter (HOSPITAL_COMMUNITY): Payer: Self-pay | Admitting: Internal Medicine

## 2022-07-28 ENCOUNTER — Encounter: Payer: Self-pay | Admitting: *Deleted

## 2022-07-29 ENCOUNTER — Ambulatory Visit: Payer: Managed Care, Other (non HMO) | Attending: Nurse Practitioner | Admitting: Nurse Practitioner

## 2022-07-29 ENCOUNTER — Encounter: Payer: Self-pay | Admitting: Nurse Practitioner

## 2022-07-29 VITALS — BP 136/96 | HR 82 | Ht 70.0 in | Wt 208.0 lb

## 2022-07-29 DIAGNOSIS — R0609 Other forms of dyspnea: Secondary | ICD-10-CM | POA: Diagnosis not present

## 2022-07-29 DIAGNOSIS — F1910 Other psychoactive substance abuse, uncomplicated: Secondary | ICD-10-CM

## 2022-07-29 DIAGNOSIS — I10 Essential (primary) hypertension: Secondary | ICD-10-CM | POA: Diagnosis not present

## 2022-07-29 DIAGNOSIS — I25119 Atherosclerotic heart disease of native coronary artery with unspecified angina pectoris: Secondary | ICD-10-CM | POA: Diagnosis not present

## 2022-07-29 DIAGNOSIS — R079 Chest pain, unspecified: Secondary | ICD-10-CM | POA: Diagnosis not present

## 2022-07-29 DIAGNOSIS — E785 Hyperlipidemia, unspecified: Secondary | ICD-10-CM

## 2022-07-29 DIAGNOSIS — E781 Pure hyperglyceridemia: Secondary | ICD-10-CM

## 2022-07-29 MED ORDER — METOPROLOL TARTRATE 100 MG PO TABS: ORAL_TABLET | ORAL | 0 refills | Status: DC

## 2022-07-29 MED ORDER — NITROGLYCERIN 0.4 MG SL SUBL
0.4000 mg | SUBLINGUAL_TABLET | SUBLINGUAL | 3 refills | Status: AC | PRN
Start: 2022-07-29 — End: ?

## 2022-07-29 NOTE — Patient Instructions (Addendum)
Medication Instructions:  Your physician has recommended you make the following change in your medication:  - Start 0.4 SL Nitro as needed for Chest Pain. The proper use and anticipated side effects of nitroglycerine has been carefully explained.  If a single episode of chest pain is not relieved by one tablet, the patient will try another within 5 minutes; and if this doesn't relieve the pain, the patient is instructed to call 911 for transportation to an emergency department.   *If you need a refill on your cardiac medications before your next appointment, please call your pharmacy*   Lab Work: BMP  If you have labs (blood work) drawn today and your tests are completely normal, you will receive your results only by: [place] (if you have MyChart) OR A paper copy in the mail If you have any lab test that is abnormal or we need to change your treatment, we will call you to review the results.   Testing/Procedures: Coronary CTA  Your physician has requested that you have an echocardiogram. Echocardiography is a painless test that uses sound waves to create images of your heart. It provides your doctor with information about the size and shape of your heart and how well your heart's chambers and valves are working. This procedure takes approximately one hour. There are no restrictions for this procedure. Please do NOT wear cologne, perfume, aftershave, or lotions (deodorant is allowed). Please arrive 15 minutes prior to your appointment time.    Follow-Up: At [facility], you and your health needs are our priority.  As part of our continuing mission to provide you with exceptional heart care, we have created designated Provider Care Teams.  These Care Teams include your primary Cardiologist (physician) and Advanced Practice Providers (APPs -  Physician Assistants and Nurse Practitioners) who all work together to provide you with the care you need, when you need it.  We  recommend signing up for the patient portal called "MyChart".  Sign up information is provided on this After Visit Summary.  MyChart is used to connect with patients for Virtual Visits (Telemedicine).  Patients are able to view lab/test results, encounter notes, upcoming appointments, etc.  Non-urgent messages can be sent to your provider as well.   To learn more about what you can do with MyChart, go to [email or web address].    Your next appointment:   6 week(s)  Provider:   Finis Bud, NP    Other Instructions   Your cardiac CT will be scheduled at one of the below locations:   [facility] [address] [place], [place] [address] [phone]  [place] [address] [place], [place] [address] [phone]  [facility] [place], [place] [address] [phone]  If scheduled at [employer], please arrive at the [facility] and Children's Entrance (Entrance C2) of [facility] 30 minutes prior to test start time. You can use the FREE valet parking offered at entrance C (encouraged to control the heart rate for the test)  Proceed to the [facility] Radiology Department (first floor) to check-in and test prep.  All radiology patients and guests should use entrance C2 at [facility], accessed from [facility], even though the hospital's physical address listed is [address].    If scheduled at [facility] or [facility], please arrive 15 mins early for  check-in and test prep.   Please follow these instructions carefully (unless otherwise directed):  Hold all erectile dysfunction medications at least 3 days (72 hrs) prior to test. (Ie viagra, cialis, sildenafil, tadalafil, etc) We will administer nitroglycerin during this exam.   On the Night Before the Test: Be sure to  Drink plenty of water. Do not consume any caffeinated/decaffeinated beverages or chocolate 12 hours prior to your test. Do not take any antihistamines 12 hours prior to your test.  On the Day of the Test: Drink plenty of water until 1 hour prior to the test. Do not eat any food 1 hour prior to test. You may take your regular medications prior to the test.  Take metoprolol (Lopressor) two hours prior to test. If you take Furosemide/Hydrochlorothiazide/Spironolactone, please HOLD on the morning of the test.      After the Test: Drink plenty of water. After receiving IV contrast, you may experience a mild flushed feeling. This is normal. On occasion, you may experience a mild rash up to 24 hours after the test. This is not dangerous. If this occurs, you can take Benadryl 25 mg and increase your fluid intake. If you experience trouble breathing, this can be serious. If it is severe call 911 IMMEDIATELY. If it is mild, please call our office. If you take any of these medications: Glipizide/Metformin, Avandament, Glucavance, please do not take 48 hours after completing test unless otherwise instructed.  We will call to schedule your test 2-4 weeks out understanding that some insurance companies will need an authorization prior to the service being performed.   For non-scheduling related questions, please contact the cardiac imaging nurse navigator should you have any questions/concerns: Marchia Bond, Cardiac Imaging Nurse Navigator Gordy Clement, Cardiac Imaging Nurse Navigator [facility] Direct Office Dial: [phone]   For scheduling needs, including cancellations and rescheduling, please call [place], [phone].   Steps to Quit Smoking Smoking tobacco is the leading cause of preventable death. It can affect almost every organ in the body. Smoking puts you and those around you at risk for developing many serious chronic diseases. Quitting smoking can be  very challenging. Do not get discouraged if you are not successful the first time. Some people need to make many attempts to quit before they achieve long-term success. Do your best to stick to your quit plan, and talk with your health care provider if you have any questions or concerns. How do I get ready to quit? When you decide to quit smoking, create a plan to help you succeed. Before you quit: Pick a date to quit. Set a date within the next 2 weeks to give you time to prepare. Write down the reasons why you are quitting. Keep this list in places where you will see it often. Tell your family, friends, and co-workers that you are quitting. Support from people you are close to can make quitting easier. Talk with your health care provider about your options for quitting smoking. Find out what treatment options are covered by your health insurance. Identify people, places, things, and activities that make you want to smoke (triggers). Avoid them. What first steps can I take to quit smoking? Throw away all cigarettes at home, at work, and in your car. Throw away smoking accessories, such as Scientist, research (medical). Clean your car. Make sure to empty the ashtray. Clean your home, including curtains and carpets. What strategies can I use to quit smoking? Talk with your health care  provider about combining strategies, such as taking medicines while you are also receiving in-person counseling. Using these two strategies together makes you more likely to succeed in quitting than if you used either strategy on its own. If you are pregnant or breastfeeding, talk with your health care provider about finding counseling or other support strategies to quit smoking. Do not take medicine to help you quit smoking unless your health care provider tells you to. Quit right away Quit smoking completely, instead of gradually reducing how much you smoke over a period of time. Stopping smoking right away may be more  successful than gradually quitting. Attend in-person counseling to help you build problem-solving skills. You are more likely to succeed in quitting if you attend counseling sessions regularly. Even short sessions of 10 minutes can be effective. Take medicine You may take medicines to help you quit smoking. Some medicines require a prescription. You can also purchase over-the-counter medicines. Medicines may have nicotine in them to replace the nicotine in cigarettes. Medicines may: Help to stop cravings. Help to relieve withdrawal symptoms. Your health care provider may recommend: Nicotine patches, gum, or lozenges. Nicotine inhalers or sprays. Non-nicotine medicine that you take by mouth. Find resources Find resources and support systems that can help you quit smoking and remain smoke-free after you quit. These resources are most helpful when you use them often. They include: Online chats with a Social worker. Telephone quitlines. Printed Furniture conservator/restorer. Support groups or group counseling. Text messaging programs. Mobile phone apps or applications. Use apps that can help you stick to your quit plan by providing reminders, tips, and encouragement. Examples of free services include Quit Guide from the CDC and smokefree.gov  What can I do to make it easier to quit?  Reach out to your family and friends for support and encouragement. Call telephone quitlines, such as 1-800-QUIT-NOW, reach out to support groups, or work with a counselor for support. Ask people who smoke to avoid smoking around you. Avoid places that trigger you to smoke, such as bars, parties, or smoke-break areas at work. Spend time with people who do not smoke. Lessen the stress in your life. Stress can be a smoking trigger for some people. To lessen stress, try: Exercising regularly. Doing deep-breathing exercises. Doing yoga. Meditating. What benefits will I see if I quit smoking? Over time, you should start to see  positive results, such as: Improved sense of smell and taste. Decreased coughing and sore throat. Slower heart rate. Lower blood pressure. Clearer and healthier skin. The ability to breathe more easily. Fewer sick days. Summary Quitting smoking can be very challenging. Do not get discouraged if you are not successful the first time. Some people need to make many attempts to quit before they achieve long-term success. When you decide to quit smoking, create a plan to help you succeed. Quit smoking right away, not slowly over a period of time. Find resources and support systems that can help you quit smoking and remain smoke-free after you quit. This information is not intended to replace advice given to you by your health care provider. Make sure you discuss any questions you have with your health care provider. Document Revised: 04/24/2021 Document Reviewed: 04/24/2021 Elsevier Patient Education  [place] refers to food and lifestyle choices that are based on the traditions of countries located on the [employer]. It focuses on eating more fruits, vegetables, whole grains, beans, nuts, seeds, and heart-healthy fats, and eating  less dairy, meat, eggs, and processed foods with added sugar, salt, and fat. This way of eating has been shown to help prevent certain conditions and improve outcomes for people who have chronic diseases, like kidney disease and heart disease. What are tips for following this plan? Reading food labels Check the serving size of packaged foods. For foods such as rice and pasta, the serving size refers to the amount of cooked product, not dry. Check the total fat in packaged foods. Avoid foods that have saturated fat or trans fats. Check the ingredient list for added sugars, such as corn syrup. Shopping  Buy a variety of foods that offer a balanced diet, including: Fresh fruits and vegetables  (produce). Grains, beans, nuts, and seeds. Some of these may be available in unpackaged forms or large amounts (in bulk). Fresh seafood. Poultry and eggs. Low-fat dairy products. Buy whole ingredients instead of prepackaged foods. Buy fresh fruits and vegetables in-season from local farmers markets. Buy plain frozen fruits and vegetables. If you do not have access to quality fresh seafood, buy precooked frozen shrimp or canned fish, such as tuna, salmon, or sardines. Stock your pantry so you always have certain foods on hand, such as olive oil, canned tuna, canned tomatoes, rice, pasta, and beans. Cooking Cook foods with extra-virgin olive oil instead of using butter or other vegetable oils. Have meat as a side dish, and have vegetables or grains as your main dish. This means having meat in small portions or adding small amounts of meat to foods like pasta or stew. Use beans or vegetables instead of meat in common dishes like chili or lasagna. Experiment with different cooking methods. Try roasting, broiling, steaming, and sauting vegetables. Add frozen vegetables to soups, stews, pasta, or rice. Add nuts or seeds for added healthy fats and plant protein at each meal. You can add these to yogurt, salads, or vegetable dishes. Marinate fish or vegetables using olive oil, lemon juice, garlic, and fresh herbs. Meal planning Plan to eat one vegetarian meal one day each week. Try to work up to two vegetarian meals, if possible. Eat seafood two or more times a week. Have healthy snacks readily available, such as: Vegetable sticks with hummus. Greek yogurt. Fruit and nut trail mix. Eat balanced meals throughout the week. This includes: Fruit: 2-3 servings a day. Vegetables: 4-5 servings a day. Low-fat dairy: 2 servings a day. Fish, poultry, or lean meat: 1 serving a day. Beans and legumes: 2 or more servings a week. Nuts and seeds: 1-2 servings a day. Whole grains: 6-8 servings a  day. Extra-virgin olive oil: 3-4 servings a day. Limit red meat and sweets to only a few servings a month. Lifestyle  Cook and eat meals together with your family, when possible. Drink enough fluid to keep your urine pale yellow. Be physically active every day. This includes: Aerobic exercise like running or swimming. Leisure activities like gardening, walking, or housework. Get 7-8 hours of sleep each night. If recommended by your health care provider, drink red wine in moderation. This means 1 glass a day for nonpregnant women and 2 glasses a day for men. A glass of wine equals 5 oz (150 mL). What foods should I eat? Fruits Apples. Apricots. Avocado. Berries. Bananas. Cherries. Dates. Figs. Grapes. Lemons. Melon. Oranges. Peaches. Plums. Pomegranate. Vegetables Artichokes. Beets. Broccoli. Cabbage. Carrots. Eggplant. Green beans. Chard. Kale. Spinach. Onions. Leeks. Peas. Squash. Tomatoes. Peppers. Radishes. Grains Whole-grain pasta. Brown rice. Bulgur wheat. Polenta. Couscous. Whole-wheat bread. [place].  Meats and other proteins Beans. Almonds. Sunflower seeds. Pine nuts. Peanuts. [place]. Salmon. Scallops. Shrimp. [place]. Tilapia. Clams. Oysters. Eggs. Poultry without skin. Dairy Low-fat milk. Cheese. Greek yogurt. Fats and oils Extra-virgin olive oil. Avocado oil. Grapeseed oil. Beverages Water. Red wine. Herbal tea. Sweets and desserts Greek yogurt with honey. Baked apples. Poached pears. Trail mix. Seasonings and condiments Basil. Cilantro. Coriander. Cumin. Mint. Parsley. Sage. Rosemary. Tarragon. Garlic. Oregano. Thyme. Pepper. Balsamic vinegar. Tahini. Hummus. Tomato sauce. Olives. Mushrooms. The items listed above may not be a complete list of foods and beverages you can eat. Contact a dietitian for more information. What foods should I limit? This is a list of foods that should be eaten rarely or only on special occasions. Fruits Fruit canned in  syrup. Vegetables Deep-fried potatoes (french fries). Grains Prepackaged pasta or rice dishes. Prepackaged cereal with added sugar. Prepackaged snacks with added sugar. Meats and other proteins Beef. Pork. Lamb. Poultry with skin. Hot dogs. [place] Salines. Dairy Ice cream. Sour cream. Whole milk. Fats and oils Butter. Canola oil. Vegetable oil. Beef fat (tallow). Lard. Beverages Juice. Sugar-sweetened soft drinks. Beer. Liquor and spirits. Sweets and desserts Cookies. Cakes. Pies. Candy. Seasonings and condiments Mayonnaise. Pre-made sauces and marinades. The items listed above may not be a complete list of foods and beverages you should limit. Contact a dietitian for more information. Summary The Mediterranean diet includes both food and lifestyle choices. Eat a variety of fresh fruits and vegetables, beans, nuts, seeds, and whole grains. Limit the amount of red meat and sweets that you eat. If recommended by your health care provider, drink red wine in moderation. This means 1 glass a day for nonpregnant women and 2 glasses a day for men. A glass of wine equals 5 oz (150 mL). This information is not intended to replace advice given to you by your health care provider. Make sure you discuss any questions you have with your health care provider. Document Revised: 06/08/2019 Document Reviewed: 04/05/2019 Elsevier Patient Education  [place].

## 2022-07-29 NOTE — Progress Notes (Signed)
Office Visit    Patient Name: Shannon Ritter Date of Encounter: 07/29/2022  PCP:  [facility], [place]  Cardiologist:  Jenkins Rouge, MD  Advanced Practice Provider:  Finis Bud, NP Electrophysiologist:  None   Chief Complaint    Shannon Ritter is a 52 y.o. male with a hx of chest pain, dyspnea on exertion, hypertension, hyperlipidemia, hypertriglyceridemia, GERD, tobacco use, and ETOH use, who presents today for regular follow-up/CP evaluation.  Past Medical History    Past Medical History:  Diagnosis Date   Angina pectoris ([place])    GERD (gastroesophageal reflux disease)    Hypertension    Past Surgical History:  Procedure Laterality Date   ANTERIOR CRUCIATE LIGAMENT REPAIR Right    CARDIAC CATHETERIZATION     COLONOSCOPY WITH PROPOFOL N/A 01/25/2022   Procedure: COLONOSCOPY WITH PROPOFOL;  Surgeon: Eloise Harman, DO;  [facility];  Service: Endoscopy;  Laterality: N/A;  11:00AM   FISTULOTOMY N/A 03/16/2021   Procedure: FISTULOTOMY VERSUS SETON;  Surgeon: Virl Cagey, MD;  Location: [facility];  Service: General;  Laterality: N/A;   POLYPECTOMY  01/25/2022   Procedure: POLYPECTOMY;  Surgeon: Eloise Harman, DO;  [facility];  Service: Endoscopy;;    Allergies  No Known Allergies  History of Present Illness    Shannon Ritter is a 52 y.o. male with a PMH as mentioned above. No documented CAD, despite having CP in the past. Also has hx of heavy alcohol use in past, tobacco use.   Saw Dr. Nadyne Coombes in past, previous cardiac cath performed was low risk.   Last seen by Dr. Johnsie Cancel on December 10, 2020. Was overall doing well.   Today he presents for follow-up.  Was at his PCP's office recently for physical, noted CP, and was referred to see his cardiologist.  Admits to occasional chest pain/pressure, left-sided with no radiation, nonexertional, 4/10 in intensity, denies any alleviating or aggravating factors, varies in  duration and admits to shortness of breath with exertion, that he attributes to lack of exercise.  Said chest pain has been chronic for him, stable over time.  Has not tried any medications. Denies any palpitations, syncope, presyncope, dizziness, orthopnea, PND, swelling or significant weight changes, acute bleeding, or claudication.  Drinks 1 bottle of alcohol once per week and also smokes half pack per day around once per week, working on weaning off alcohol and tobacco use.  Does have positive family history of cardiovascular disease.  EKGs/Labs/Other Studies Reviewed:   The following studies were reviewed today:   EKG:  EKG is ordered today.  The ekg ordered today demonstrates normal sinus rhythm, 82 bpm, LVH, nonspecific T wave abnormality, no acute ischemic changes.  Coronary CTA on December 17, 2020: IMPRESSION: 1. Coronary calcium score of 2.2. This was 71st percentile for age-, sex, and race-matched controls.   2. Normal coronary origin with left dominance.   3. Minimal mixed density plaque (<25%) in the proximal LAD.   RECOMMENDATIONS: 1. Minimal non-obstructive CAD (0-24%). Consider non-atherosclerotic causes of chest pain. Consider preventive therapy and risk factor modification.  Recent Labs: 01/21/2022: BUN 36; Creatinine, Ser 1.52; Potassium 3.5; Sodium 136  Recent Lipid Panel No results found for: "CHOL", "TRIG", "HDL", "CHOLHDL", "VLDL", "LDLCALC", "LDLDIRECT"  Home Medications   Current Meds  Medication Sig   ALPRAZolam (XANAX) 1 MG tablet Take 1 mg by mouth at bedtime.   Ascorbic Acid (VITAMIN C PO)  Take 1 tablet by mouth daily.   aspirin 81 MG tablet Take 81 mg by mouth daily.   atorvastatin (LIPITOR) 20 MG tablet Take 20 mg by mouth daily.    cetirizine (ZYRTEC) 10 MG tablet Take 10 mg by mouth daily.   fenofibrate 160 MG tablet Take 160 mg by mouth daily.   hydrochlorothiazide (HYDRODIURIL) 25 MG tablet Take 25 mg by mouth daily.   losartan (COZAAR) 100 MG  tablet Take 100 mg by mouth daily.   metoprolol tartrate (LOPRESSOR) 100 MG tablet Take 1 tablet by mouth 2 hours prior to CT Scan.   Multiple Vitamins-Minerals (MULTIVITAMIN ADULT PO) Take 1 tablet by mouth daily.   Omega-3 Fatty Acids (FISH OIL PO) Take 1 capsule by mouth daily.   pantoprazole (PROTONIX) 40 MG tablet Take 40 mg by mouth daily.     Review of Systems    All other systems reviewed and are otherwise negative except as noted above.  Physical Exam    VS:  BP (!) 136/96   Pulse 82   Ht '5\' 10"'$  (1.778 m)   Wt 208 lb (94.3 kg)   SpO2 97%   BMI 29.84 kg/m  , BMI Body mass index is 29.84 kg/m.  Wt Readings from Last 3 Encounters:  07/29/22 208 lb (94.3 kg)  01/25/22 187 lb (84.8 kg)  01/21/22 195 lb 15.8 oz (88.9 kg)     GEN: Well nourished, well developed, in no acute distress. HEENT: normal. Neck: Supple, no JVD, carotid bruits, or masses. Cardiac: S1/S2, RRR, no murmurs, rubs, or gallops. No clubbing, cyanosis, edema.  Radials/PT 2+ and equal bilaterally.  Respiratory:  Respirations regular and unlabored, clear to auscultation bilaterally. MS: No deformity or atrophy. Skin: Warm and dry, no rash. Neuro:  Strength and sensation are intact. Psych: Normal affect.  Assessment & Plan    CAD, chest pain of uncertain etiology, DOE Previous cardiac catheterization in past was considered low risk.  Coronary CTA in 2022 revealed minimal, nonobstructive CAD with coronary calcium score 2.2.  Admits to chronic, stable chest pain, left-sided, not associated with exertion. 12 lead ECG showed no acute ischemic changes. Does have multiple risk factors for CAD, continues to smoke and drink alcohol.  Will repeat coronary CTA at this time.  Will prescribe metoprolol tartrate 100 mg once 2 hours prior to testing.  Will check BMET prior to testing.  Will prescribe nitroglycerin as needed.  Will also obtain echocardiogram for DOE. Continue rest of medication regimen. Heart healthy diet  and regular cardiovascular exercise encouraged. ED precautions discussed.   HTN Blood pressure is borderline elevated today. Discussed SBP goal < 130. Continue current medication regimen at this time. Given BP log today and discussed to monitor BP at home at least 2 hours after medications and sitting for 5-10 minutes.  Plan to initiate beta-blocker/increase HCTZ at next office visit if blood pressure is not improved. Heart healthy diet and regular cardiovascular exercise encouraged.   HLD, hypertriglyceridemia Managed by PCP.  Continue atorvastatin and fenofibrate. Heart healthy diet and regular cardiovascular exercise encouraged.   Substance abuse Discussed to wean off alcohol and tobacco use.  Smoking cessation encouraged and discussed.  He verbalized understanding, planning to decrease usage.   Disposition: Follow up in 6 week(s) with Jenkins Rouge, MD or APP.  Signed, Finis Bud, NP 07/29/2022, 1:15 PM [facility]

## 2022-08-02 ENCOUNTER — Ambulatory Visit: Payer: Managed Care, Other (non HMO) | Attending: Nurse Practitioner

## 2022-08-02 DIAGNOSIS — I358 Other nonrheumatic aortic valve disorders: Secondary | ICD-10-CM | POA: Diagnosis not present

## 2022-08-02 DIAGNOSIS — R0609 Other forms of dyspnea: Secondary | ICD-10-CM

## 2022-08-02 LAB — ECHOCARDIOGRAM COMPLETE
AR max vel: 3.13 cm2
AV Peak grad: 5.8 mmHg
Ao pk vel: 1.21 m/s
Area-P 1/2: 3.99 cm2
Calc EF: 66.6 %
MV M vel: 2.13 m/s
MV Peak grad: 18.1 mmHg
S' Lateral: 2.9 cm
Single Plane A2C EF: 60.4 %
Single Plane A4C EF: 73.2 %

## 2022-08-03 ENCOUNTER — Telehealth (HOSPITAL_COMMUNITY): Payer: Self-pay | Admitting: Emergency Medicine

## 2022-08-03 NOTE — Telephone Encounter (Signed)
Reaching out to patient to offer assistance regarding upcoming cardiac imaging study; pt verbalizes understanding of appt date/time, parking situation and where to check in, pre-test NPO status and medications ordered, and verified current allergies; name and call back number provided for further questions should they arise Marchia Bond RN Navigator Cardiac Imaging [facility] [phone] office [phone] cell  Arrival [address] entrance 100mg  metoprolol tartrate Denies iv issues Aware contrast/nitro

## 2022-08-05 ENCOUNTER — Ambulatory Visit (HOSPITAL_COMMUNITY)
Admission: RE | Admit: 2022-08-05 | Discharge: 2022-08-05 | Disposition: A | Discharge status: Home / Self Care | Payer: Managed Care, Other (non HMO) | Source: Ambulatory Visit | Attending: Nurse Practitioner | Admitting: Nurse Practitioner

## 2022-08-05 DIAGNOSIS — R079 Chest pain, unspecified: Secondary | ICD-10-CM | POA: Insufficient documentation

## 2022-08-05 DIAGNOSIS — R072 Precordial pain: Secondary | ICD-10-CM | POA: Diagnosis not present

## 2022-08-05 MED ORDER — NITROGLYCERIN 0.4 MG SL SUBL
SUBLINGUAL_TABLET | SUBLINGUAL | Status: AC
  Filled 2022-08-05: qty 2

## 2022-08-05 MED ORDER — NITROGLYCERIN 0.4 MG SL SUBL
0.8000 mg | SUBLINGUAL_TABLET | Freq: Once | SUBLINGUAL | Status: AC
  Administered 2022-08-05: 0.8 mg via SUBLINGUAL

## 2022-08-05 MED ORDER — IOHEXOL 350 MG/ML SOLN
95.0000 mL | Freq: Once | INTRAVENOUS | Status: AC | PRN
  Administered 2022-08-05: 95 mL via INTRAVENOUS

## 2022-08-13 ENCOUNTER — Telehealth: Payer: Self-pay | Admitting: Nurse Practitioner

## 2022-08-13 NOTE — Telephone Encounter (Signed)
Patient is returning call to discuss CT results. 

## 2022-09-07 ENCOUNTER — Encounter: Payer: Self-pay | Admitting: Nurse Practitioner

## 2022-09-07 ENCOUNTER — Ambulatory Visit: Payer: Managed Care, Other (non HMO) | Attending: Nurse Practitioner | Admitting: Nurse Practitioner

## 2022-09-07 VITALS — BP 120/96 | HR 85 | Ht 70.0 in | Wt 209.2 lb

## 2022-09-07 DIAGNOSIS — R0609 Other forms of dyspnea: Secondary | ICD-10-CM

## 2022-09-07 DIAGNOSIS — R079 Chest pain, unspecified: Secondary | ICD-10-CM | POA: Diagnosis not present

## 2022-09-07 DIAGNOSIS — E785 Hyperlipidemia, unspecified: Secondary | ICD-10-CM

## 2022-09-07 DIAGNOSIS — F1910 Other psychoactive substance abuse, uncomplicated: Secondary | ICD-10-CM

## 2022-09-07 DIAGNOSIS — I25119 Atherosclerotic heart disease of native coronary artery with unspecified angina pectoris: Secondary | ICD-10-CM | POA: Diagnosis not present

## 2022-09-07 DIAGNOSIS — I10 Essential (primary) hypertension: Secondary | ICD-10-CM

## 2022-09-07 DIAGNOSIS — E781 Pure hyperglyceridemia: Secondary | ICD-10-CM

## 2022-09-07 NOTE — Patient Instructions (Addendum)
Medication Instructions:  Your physician recommends that you continue on your current medications as directed. Please refer to the Current Medication list given to you today.   Labwork: none  Testing/Procedures: none  Follow-Up:  Your physician recommends that you schedule a follow-up appointment in: 6 months  Any Other Special Instructions Will Be Listed Below (If Applicable).   Please look into purchasing the Omron Blood Pressure Cuff.  If you need a refill on your cardiac medications before your next appointment, please call your pharmacy.

## 2022-09-07 NOTE — Progress Notes (Signed)
Office Visit    Patient Name: Shannon Ritter Date of Encounter: 09/07/2022  PCP:  Elfredia Nevins, MD   [facility]  Cardiologist:  Charlton Haws, MD  Advanced Practice Provider:  Sharlene Dory, NP Electrophysiologist:  None   Chief Complaint    Shannon Ritter is a 52 y.o. male with a hx of chest pain, dyspnea on exertion, hypertension, hyperlipidemia, hypertriglyceridemia, GERD, tobacco use, and ETOH use, who presents today for CP/DOE follow-up.  Past Medical History    Past Medical History:  Diagnosis Date   Angina pectoris    GERD (gastroesophageal reflux disease)    Hypertension    Past Surgical History:  Procedure Laterality Date   ANTERIOR CRUCIATE LIGAMENT REPAIR Right    CARDIAC CATHETERIZATION     COLONOSCOPY WITH PROPOFOL N/A 01/25/2022   Procedure: COLONOSCOPY WITH PROPOFOL;  Surgeon: Lanelle Bal, DO;  [facility];  Service: Endoscopy;  Laterality: N/A;  11:00AM   FISTULOTOMY N/A 03/16/2021   Procedure: FISTULOTOMY VERSUS SETON;  Surgeon: Lucretia Roers, MD;  Location: [facility];  Service: General;  Laterality: N/A;   POLYPECTOMY  01/25/2022   Procedure: POLYPECTOMY;  Surgeon: Lanelle Bal, DO;  [facility];  Service: Endoscopy;;    Allergies  No Known Allergies  History of Present Illness    Shannon Ritter is a 52 y.o. male with a PMH as mentioned above. No documented CAD, despite having CP in the past. Also has hx of heavy alcohol use in past, tobacco use.   Saw Dr. Nadara Eaton in past, previous cardiac cath performed was low risk.   Last seen by Dr. Eden Emms on December 10, 2020. Was overall doing well.   Last saw pt for follow-up on 07/29/22.  Stated he was at his PCP's office recently for physical, noted CP, and was referred to see his cardiologist.  Admitted to occasional chest pain/pressure, left-sided with no radiation, nonexertional, 4/10 in intensity, denied any alleviating or aggravating factors, varied in  duration and admitted to shortness of breath with exertion, that he attributed to lack of exercise.  Said chest pain has been chronic for him, stable over time. Denies any associated symptoms.  Reported drinking 1 bottle of alcohol once per week and also smoked half pack per day around once per week, was working on weaning off alcohol and tobacco use.  Noted positive family history of cardiovascular disease.  Today he presents for follow-up.  Says he is doing the same as last office visit.  Chest pain has remained stable.  Continues to admit to shortness of breath with exertion.  Denies any palpitations, syncope, presyncope, dizziness, orthopnea, PND, swelling or significant weight changes, acute bleeding, or claudication.  He is continuing to work on weaning himself off alcohol and tobacco use.  SH: Works at [employer] Reviewed:   The following studies were reviewed today:   EKG:  EKG is not ordered today.   Coronary CTA 07/2022: IMPRESSION: 1. Minimal nonobstructive CAD, CADRADS = 1. Focal mixed calcified and noncalcified plaque in proximal LAD with 1-24% stenosis, positive remodeling.   2. Coronary calcium score of 22. This was 77th percentile for age and sex matched control.   3. Normal coronary origin with left dominance.   INTERPRETATION:   CAD-RADS 1: Minimal non-obstructive CAD (1-24%). Consider non-atherosclerotic causes of chest pain. Consider preventive therapy and risk factor modification.  Echocardiogram 07/2022:  1. Left ventricular ejection fraction, by estimation,  is 60 to 65%. The  left ventricle has normal function. The left ventricle has no regional  wall motion abnormalities. Left ventricular diastolic parameters are  indeterminate. The average left  ventricular global longitudinal strain is -24.3 %. The global longitudinal  strain is normal.   2. Right ventricular systolic function is normal. The right ventricular  size is normal.  Tricuspid regurgitation signal is inadequate for assessing  PA pressure.   3. The mitral valve is grossly normal. Trivial mitral valve  regurgitation.   4. The aortic valve is tricuspid. There is mild calcification of the  aortic valve. Aortic valve regurgitation is not visualized.   5. The inferior vena cava is normal in size with greater than 50%  respiratory variability, suggesting right atrial pressure of 3 mmHg.   Comparison(s): Prior images reviewed side by side. LVEF remains normal  range at 60-65%.    Recent Labs: 01/21/2022: BUN 36; Creatinine, Ser 1.52; Potassium 3.5; Sodium 136  Recent Lipid Panel No results found for: "CHOL", "TRIG", "HDL", "CHOLHDL", "VLDL", "LDLCALC", "LDLDIRECT"  Home Medications   Current Meds  Medication Sig   ALPRAZolam (XANAX) 1 MG tablet Take 1 mg by mouth at bedtime.   Ascorbic Acid (VITAMIN C PO) Take 1 tablet by mouth daily.   aspirin 81 MG tablet Take 81 mg by mouth daily.   atorvastatin (LIPITOR) 20 MG tablet Take 20 mg by mouth daily.    cetirizine (ZYRTEC) 10 MG tablet Take 10 mg by mouth daily.   fenofibrate 160 MG tablet Take 160 mg by mouth daily.   hydrochlorothiazide (HYDRODIURIL) 25 MG tablet Take 25 mg by mouth daily.   losartan (COZAAR) 100 MG tablet Take 100 mg by mouth daily.   Multiple Vitamins-Minerals (MULTIVITAMIN ADULT PO) Take 1 tablet by mouth daily.   nitroGLYCERIN (NITROSTAT) 0.4 MG SL tablet Place 1 tablet (0.4 mg total) under the tongue every 5 (five) minutes as needed for chest pain. If a single episode of chest pain is not relieved by one tablet, the patient will try another within 5 minutes; and if this doesn't relieve the pain, the patient is instructed to call 911 for transportation to an emergency department.   Omega-3 Fatty Acids (FISH OIL PO) Take 1 capsule by mouth daily.   pantoprazole (PROTONIX) 40 MG tablet Take 40 mg by mouth daily.    Review of Systems    All other systems reviewed and are otherwise  negative except as noted above.  Physical Exam    VS:  BP (!) 120/96 (BP Location: Left Arm, Patient Position: Sitting, Cuff Size: Large)   Pulse 85   Ht  (1.778 m)   Wt 209 lb 3.2 oz (94.9 kg)   SpO2 96%   BMI 30.02 kg/m  , BMI Body mass index is 30.02 kg/m.  Wt Readings from Last 3 Encounters:  09/07/22 209 lb 3.2 oz (94.9 kg)  07/29/22 208 lb (94.3 kg)  01/25/22 187 lb (84.8 kg)     GEN: Well nourished, well developed, in no acute distress. HEENT: normal. Neck: Supple, no JVD, carotid bruits, or masses. Cardiac: S1/S2, RRR, no murmurs, rubs, or gallops. No clubbing, cyanosis, edema.  Radials/PT 2+ and equal bilaterally.  Respiratory:  Respirations regular and unlabored, clear to auscultation bilaterally. MS: No deformity or atrophy. Skin: Warm and dry, no rash. Neuro:  Strength and sensation are intact. Psych: Normal affect.  Assessment & Plan    Minimal nonobstructive CAD, chest pain of uncertain etiology, DOE Previous  cardiac catheterization in past was considered low risk.  Recent Coronary CTA in 2022 revealed minimal nonobstructive CAD, coronary calcium score of 22.  Recent echo overall unremarkable. Discussed/recommended starting low-dose beta-blocker, however patient declines at this time.  Continue aspirin, atorvastatin, fenofibrate, losartan, and nitroglycerin as needed. Heart healthy diet and regular cardiovascular exercise encouraged. ED precautions discussed.  Lifestyle modifications discussed.  HTN Blood pressure is stable. Discussed SBP goal < 130. Continue current medication regimen at this time. Discussed to monitor BP at home at least 2 hours after medications and sitting for 5-10 minutes. Heart healthy diet and regular cardiovascular exercise encouraged.  Patient declines starting beta-blocker at this visit.  Recommended Omron cuff to monitor BP at home.  HLD, hypertriglyceridemia Managed by PCP.  Continue atorvastatin and fenofibrate. Heart healthy  diet and regular cardiovascular exercise encouraged.   Substance abuse Discussed to wean off alcohol and tobacco use.  Smoking cessation encouraged and discussed.  He verbalized understanding, planning to decrease usage.   Disposition: Follow up in 6 months with Charlton Haws, MD or APP.  Signed, Sharlene Dory, NP 09/07/2022, 9:21 AM [facility]

## 2023-04-19 ENCOUNTER — Encounter: Payer: Self-pay | Admitting: Nurse Practitioner

## 2023-04-19 ENCOUNTER — Ambulatory Visit: Payer: Managed Care, Other (non HMO) | Attending: Nurse Practitioner | Admitting: Nurse Practitioner

## 2023-04-19 VITALS — BP 128/74 | HR 91 | Ht 70.0 in | Wt 213.0 lb

## 2023-04-19 DIAGNOSIS — I10 Essential (primary) hypertension: Secondary | ICD-10-CM

## 2023-04-19 DIAGNOSIS — E785 Hyperlipidemia, unspecified: Secondary | ICD-10-CM

## 2023-04-19 DIAGNOSIS — F1910 Other psychoactive substance abuse, uncomplicated: Secondary | ICD-10-CM

## 2023-04-19 DIAGNOSIS — Z131 Encounter for screening for diabetes mellitus: Secondary | ICD-10-CM

## 2023-04-19 DIAGNOSIS — R0609 Other forms of dyspnea: Secondary | ICD-10-CM

## 2023-04-19 DIAGNOSIS — E669 Obesity, unspecified: Secondary | ICD-10-CM

## 2023-04-19 DIAGNOSIS — I2510 Atherosclerotic heart disease of native coronary artery without angina pectoris: Secondary | ICD-10-CM

## 2023-04-19 DIAGNOSIS — I25119 Atherosclerotic heart disease of native coronary artery with unspecified angina pectoris: Secondary | ICD-10-CM

## 2023-04-19 DIAGNOSIS — E781 Pure hyperglyceridemia: Secondary | ICD-10-CM

## 2023-04-19 NOTE — Progress Notes (Unsigned)
Office Visit    Patient Name: Shannon Ritter Date of Encounter: 04/19/2023 PCP:  Elfredia Nevins, MD [facility]  Cardiologist:  Charlton Haws, MD  Advanced Practice Provider:  Sharlene Dory, NP Electrophysiologist:  None   Chief Complaint and HPI    Shannon Ritter is a 52 y.o. male with a hx of chest pain, dyspnea on exertion, hypertension, hyperlipidemia, hypertriglyceridemia, GERD, tobacco use, and ETOH use, who presents today for scheduled follow-up. Also has hx of heavy alcohol use in past, tobacco use.   Saw Dr. Nadara Eaton in past, previous cardiac cath performed was low risk.   Last seen by Dr. Eden Emms on December 10, 2020. Was overall doing well.   Last saw pt for follow-up on 07/29/22.  Stated he was at his PCP's office recently for physical, noted CP, and was referred to see his cardiologist.  Admitted to occasional chest pain/pressure, left-sided with no radiation, nonexertional, 4/10 in intensity, denied any alleviating or aggravating factors, varied in duration and admitted to shortness of breath with exertion, that he attributed to lack of exercise.  Said chest pain has been chronic for him, stable over time. Denies any associated symptoms.  Reported drinking 1 bottle of alcohol once per week and also smoked half pack per day around once per week, was working on weaning off alcohol and tobacco use.  Noted positive family history of cardiovascular disease.  09/07/2022 -  Today he presents for follow-up.  Says he is doing the same as last office visit.  Chest pain has remained stable.  Continues to admit to shortness of breath with exertion.  Denies any palpitations, syncope, presyncope, dizziness, orthopnea, PND, swelling or significant weight changes, acute bleeding, or claudication.  He is continuing to work on weaning himself off alcohol and tobacco use.  04/19/2023 -he presents today for follow-up.  He is doing well and denies any changes to his health since I last saw  him.  Denies any acute cardiac complaints or issues.  He is due for blood work to be drawn. Denies any chest pain, shortness of breath, palpitations, syncope, presyncope, dizziness, orthopnea, PND, swelling or significant weight changes, acute bleeding, or claudication.  He says he wants to start increasing his physical activity as he says he remains sedentary at work and works at [employer].  SH: Works at [employer] Reviewed:   The following studies were reviewed today:   EKG:  EKG is not ordered today.   Coronary CTA 07/2022: IMPRESSION: 1. Minimal nonobstructive CAD, CADRADS = 1. Focal mixed calcified and noncalcified plaque in proximal LAD with 1-24% stenosis, positive remodeling.   2. Coronary calcium score of 22. This was 77th percentile for age and sex matched control.   3. Normal coronary origin with left dominance.   INTERPRETATION:   CAD-RADS 1: Minimal non-obstructive CAD (1-24%). Consider non-atherosclerotic causes of chest pain. Consider preventive therapy and risk factor modification.  Echocardiogram 07/2022:  1. Left ventricular ejection fraction, by estimation, is 60 to 65%. The  left ventricle has normal function. The left ventricle has no regional  wall motion abnormalities. Left ventricular diastolic parameters are  indeterminate. The average left  ventricular global longitudinal strain is -24.3 %. The global longitudinal  strain is normal.   2. Right ventricular systolic function is normal. The right ventricular  size is normal. Tricuspid regurgitation signal is inadequate for assessing  PA pressure.   3. The mitral valve is grossly normal. Trivial mitral  valve  regurgitation.   4. The aortic valve is tricuspid. There is mild calcification of the  aortic valve. Aortic valve regurgitation is not visualized.   5. The inferior vena cava is normal in size with greater than 50%  respiratory variability, suggesting right atrial pressure  of 3 mmHg.   Comparison(s): Prior images reviewed side by side. LVEF remains normal  range at 60-65%.    Review of Systems    All other systems reviewed and are otherwise negative except as noted above.  Physical Exam    VS:  BP 128/74   Pulse 91   Ht 5\' 10"  (1.778 m)   Wt 213 lb (96.6 kg)   SpO2 95%   BMI 30.56 kg/m  , BMI Body mass index is 30.56 kg/m.  Wt Readings from Last 3 Encounters:  04/19/23 213 lb (96.6 kg)  09/07/22 209 lb 3.2 oz (94.9 kg)  07/29/22 208 lb (94.3 kg)     GEN: Well nourished, well developed, in no acute distress. HEENT: normal. Neck: Supple, no JVD, carotid bruits, or masses. Cardiac: S1/S2, RRR, no murmurs, rubs, or gallops. No clubbing, cyanosis, edema.  Radials/PT 2+ and equal bilaterally.  Respiratory:  Respirations regular and unlabored, clear to auscultation bilaterally. MS: No deformity or atrophy. Skin: Warm and dry, no rash. Neuro:  Strength and sensation are intact. Psych: Normal affect.  Assessment & Plan    Minimal nonobstructive CAD, DOE Previous cardiac catheterization in past was considered low risk.  Recent coronary CTA earlier this year revealed minimal nonobstructive CAD, coronary calcium score of 22.  Past echo overall unremarkable. DOE appears related to weight - see #5 below. Continue aspirin, atorvastatin, fenofibrate, losartan, Lopressor, and nitroglycerin as needed. Heart healthy diet and regular cardiovascular exercise encouraged. ED precautions discussed.  Lifestyle modifications discussed.  Will obtain CBC.  HTN Blood pressure is stable. Discussed SBP goal < 130. Continue current medication regimen at this time. Discussed to monitor BP at home at least 2 hours after medications and sitting for 5-10 minutes. Heart healthy diet and regular cardiovascular exercise encouraged.  Recommended Omron cuff to monitor BP at home.  HLD, hypertriglyceridemia Will obtain FLP and CMET. Continue atorvastatin and fenofibrate. Heart  healthy diet and regular cardiovascular exercise encouraged.   Substance abuse Previously discussed to wean off alcohol and tobacco use.  Smoking cessation encouraged and discussed.  He verbalized understanding, planning to decrease usage.  5.  Obesity, screening for diabetes mellitus Weight loss via diet and exercise encouraged. Discussed the impact being overweight would have on cardiovascular risk.  Will obtain TSH and A1c as he is due for this to be checked.   Disposition: Follow up in 6 months with Charlton Haws, MD or APP.  Signed, Sharlene Dory, NP

## 2023-04-19 NOTE — Patient Instructions (Addendum)
Medication Instructions:  Your physician recommends that you continue on your current medications as directed. Please refer to the Current Medication list given to you today.  Labwork: In 1-2 weeks   Testing/Procedures: None   Follow-Up: Your physician recommends that you schedule a follow-up appointment in: 6 months   Any Other Special Instructions Will Be Listed Below (If Applicable).  If you need a refill on your cardiac medications before your next appointment, please call your pharmacy.

## 2023-10-03 NOTE — Progress Notes (Deleted)
 CARDIOLOGY CONSULT NOTE       Patient ID: Shannon Ritter MRN: [id number] DOB/AGE: Jan 01, 1971 53 y.o.  Admit date: (Not on file) Referring Physician: Lewayne Records Primary Physician: Kathyleen Parkins, MD Primary Cardiologist: Stann Earnest Reason for Consultation: Chest pain/ Dyspnea   HPI:  53 y.o. referred by Dr Lewayne Records for chest pain and dyspnea Seen by me only once 12/10/20  History of GERD and HTN and HLD on statin He Has no documented CAD despite angina being in his PMH. He has had issues with peri rectal abscess ? Fistula Needs more definitive surgery with Dr Collene Dawson This has been going on since March 2022  He has some bad lifestyle issues. Drinks too much beer ETOH runs in family dad and uncle Smokes when he drinks BP labile with this and diet started on norvasc in addition to ACE/diuretic  He saw Dr Berry Bristol about 13 years ago for chest pain and had low risk non obstructive cath   Cardiac CTA 12/17/20 calcium score only 2.2 with minimal 0-24% left dominant cors TTE 08/02/22 normal EF 60-65% no sig valve dx Cardiac CTA 08/10/22 calcium score 22 and CAD RADS 1 non obstructive CAD 1-24% proximal LAD  Working for [employer]   ***     ROS All other systems reviewed and negative except as noted above  Past Medical History:  Diagnosis Date   Angina pectoris (HCC)    GERD (gastroesophageal reflux disease)    Hypertension     Family History  Problem Relation Age of Onset   Hyperlipidemia Mother    Hypertension Mother    Diabetes Mother    Cancer Father     Social History   Socioeconomic History   Marital status: Married    Spouse name: Not on file   Number of children: Not on file   Years of education: Not on file   Highest education level: Not on file  Occupational History   Not on file  Tobacco Use   Smoking status: Some Days    Current packs/day: 0.25    Average packs/day: 0.3 packs/day for 30.0 years (7.5 ttl pk-yrs)    Types: Cigarettes    Passive exposure: Never    Smokeless tobacco: Never  Vaping Use   Vaping status: Never Used  Substance and Sexual Activity   Alcohol use: Yes    Alcohol/week: 36.0 standard drinks of alcohol    Types: 36 Cans of beer per week    Comment: 2-3 times week   Drug use: Never   Sexual activity: Yes  Other Topics Concern   Not on file  Social History Narrative   Not on file   Social Drivers of Health   Financial Resource Strain: Not on file  Food Insecurity: Not on file  Transportation Needs: Not on file  Physical Activity: Not on file  Stress: Not on file  Social Connections: Unknown (09/29/2021)   Received from [facility], [facility]   Social Network    Social Network: Not on file  Intimate Partner Violence: Unknown (08/21/2021)   Received from [facility], [facility]   HITS    Physically Hurt: Not on file    Insult or Talk Down To: Not on file    Threaten Physical Harm: Not on file    Scream or Curse: Not on file    Past Surgical History:  Procedure Laterality Date   ANTERIOR CRUCIATE LIGAMENT REPAIR Right    CARDIAC CATHETERIZATION     COLONOSCOPY WITH PROPOFOL  N/A  01/25/2022   Procedure: COLONOSCOPY WITH PROPOFOL ;  Surgeon: Vinetta Greening, DO;  [facility];  Service: Endoscopy;  Laterality: N/A;  11:00AM   FISTULOTOMY N/A 03/16/2021   Procedure: FISTULOTOMY VERSUS SETON;  Surgeon: Awilda Bogus, MD;  Location: [facility];  Service: General;  Laterality: N/A;   POLYPECTOMY  01/25/2022   Procedure: POLYPECTOMY;  Surgeon: Vinetta Greening, DO;  [facility];  Service: Endoscopy;;      Current Outpatient Medications:    ALPRAZolam (XANAX) 1 MG tablet, Take 1 mg by mouth at bedtime., Disp: , Rfl:    Ascorbic Acid (VITAMIN C PO), Take 1 tablet by mouth daily., Disp: , Rfl:    aspirin 81 MG tablet, Take 81 mg by mouth daily., Disp: , Rfl:    atorvastatin (LIPITOR) 20 MG tablet, Take 20 mg by mouth daily. , Disp: , Rfl:    cetirizine (ZYRTEC) 10 MG tablet, Take 10 mg  by mouth daily., Disp: , Rfl:    fenofibrate 160 MG tablet, Take 160 mg by mouth daily., Disp: , Rfl:    hydrochlorothiazide (HYDRODIURIL) 25 MG tablet, Take 25 mg by mouth daily., Disp: , Rfl:    losartan (COZAAR) 100 MG tablet, Take 100 mg by mouth daily., Disp: , Rfl:    metoprolol  tartrate (LOPRESSOR ) 100 MG tablet, Take 1 tablet by mouth 2 hours prior to CT Scan., Disp: 1 tablet, Rfl: 0   Multiple Vitamins-Minerals (MULTIVITAMIN ADULT PO), Take 1 tablet by mouth daily., Disp: , Rfl:    nitroGLYCERIN  (NITROSTAT ) 0.4 MG SL tablet, Place 1 tablet (0.4 mg total) under the tongue every 5 (five) minutes as needed for chest pain. If a single episode of chest pain is not relieved by one tablet, the patient will try another within 5 minutes; and if this doesn't relieve the pain, the patient is instructed to call 911 for transportation to an emergency department., Disp: 25 tablet, Rfl: 3   Omega-3 Fatty Acids (FISH OIL PO), Take 1 capsule by mouth daily., Disp: , Rfl:    pantoprazole (PROTONIX) 40 MG tablet, Take 40 mg by mouth daily., Disp: , Rfl:     Physical Exam: There were no vitals taken for this visit.   Affect appropriate Healthy:  appears stated age HEENT: normal Neck supple with no adenopathy JVP normal no bruits no thyromegaly Lungs clear with no wheezing and good diaphragmatic motion Heart:  S1/S2 no murmur, no rub, gallop or click PMI normal Abdomen: benighn, BS positve, no tenderness, no AAA no bruit.  No HSM or HJR Distal pulses intact with no bruits No edema Neuro non-focal Skin warm and dry No muscular weakness   Labs:   Lab Results  Component Value Date   WBC 7.3 11/04/2020   HGB 15.5 11/04/2020   HCT 46.4 11/04/2020   MCV 90.6 11/04/2020   PLT 286 11/04/2020   No results for input(s): "NA", "K", "CL", "CO2", "BUN", "CREATININE", "CALCIUM", "PROT", "BILITOT", "ALKPHOS", "ALT", "AST", "GLUCOSE" in the last 168 hours.  Invalid input(s): "LABALBU" No results  found for: "CKTOTAL", "CKMB", "CKMBINDEX", "TROPONINI" No results found for: "CHOL" No results found for: "HDL" No results found for: "LDLCALC" No results found for: "TRIG" No results found for: "CHOLHDL" No results found for: "LDLDIRECT"    Radiology: No results found.  EKG: ST rate 123 nonspecific ST changes 07/17/20 10/03/2023 NSR rate 74 normal    ASSESSMENT AND PLAN:   Chest Pain:  atypical  multiple cardiac CTA;s most recent  08/10/22 with low calcium score CAD-RADS 1 Preoperative:  Clear to have repeat rectal surgery  HTN: better controlled with Norvasc   HLD:  continue statin labs with primary  GERD:  discussed low carb diet continue protonix ETOH:  abuse has cut back on beer but having [place] now long term health risks including cirrhosis discussed and issues with refractory BP control due to ETOH discussed    F/U PRN    Signed: Janelle Mediate 10/03/2023, 2:45 PM

## 2023-10-17 ENCOUNTER — Ambulatory Visit: Payer: Managed Care, Other (non HMO) | Admitting: Cardiovascular Disease

## 2024-01-17 NOTE — Progress Notes (Signed)
 Office Visit    Patient Name: Shannon Ritter Date of Encounter: 04/19/2023 PCP:  Bertell Satterfield, MD [facility]  Cardiologist:  Maude Emmer, MD  Advanced Practice Provider:  Miriam Norris, NP Electrophysiologist:  None   Chief Complaint and HPI    Shannon Ritter is a 53 y.o. male with a hx of chest pain, dyspnea on exertion, hypertension, hyperlipidemia, hypertriglyceridemia, GERD, tobacco use, and ETOH use, who presents today for scheduled follow-up. Also has hx of heavy alcohol use in past, tobacco use. I have not seen since 2022. Followed by NP.   Saw Dr. Margaretann in past, previous cardiac cath performed was low risk.   Last seen by me on December 10, 2020. Was overall doing well.   Complains of occasional chest pain and dyspnea   Cardiac CTA done 08/06/22 with score 22, 77 th percentile  CAD RADS 1 1-24% stenosis in proximal LAD    SH: Works at [employer] 25 years Married 3 times. Current wife with two older kids He has 14/17 yo from 2nd marriage that are in/out. Still smoking and drinking heavily with beer 3x/week  Weight is up Deconditioned some exertional dyspnea has not seen primary in a while   EKGs/Labs/Other Studies Reviewed:   The following studies were reviewed today:   EKG:  EKG is not ordered today.   Coronary CTA 07/2022: IMPRESSION: 1. Minimal nonobstructive CAD, CADRADS = 1. Focal mixed calcified and noncalcified plaque in proximal LAD with 1-24% stenosis, positive remodeling.   2. Coronary calcium score of 22. This was 77th percentile for age and sex matched control.   3. Normal coronary origin with left dominance.   INTERPRETATION:   CAD-RADS 1: Minimal non-obstructive CAD (1-24%). Consider non-atherosclerotic causes of chest pain. Consider preventive therapy and risk factor modification.  Echocardiogram 07/2022:  1. Left ventricular ejection fraction, by estimation, is 60 to 65%. The  left ventricle has normal function. The  left ventricle has no regional  wall motion abnormalities. Left ventricular diastolic parameters are  indeterminate. The average left  ventricular global longitudinal strain is -24.3 %. The global longitudinal  strain is normal.   2. Right ventricular systolic function is normal. The right ventricular  size is normal. Tricuspid regurgitation signal is inadequate for assessing  PA pressure.   3. The mitral valve is grossly normal. Trivial mitral valve  regurgitation.   4. The aortic valve is tricuspid. There is mild calcification of the  aortic valve. Aortic valve regurgitation is not visualized.   5. The inferior vena cava is normal in size with greater than 50%  respiratory variability, suggesting right atrial pressure of 3 mmHg.   Comparison(s): Prior images reviewed side by side. LVEF remains normal  range at 60-65%.    Review of Systems    All other systems reviewed and are otherwise negative except as noted above.  Physical Exam    VS:  BP 134/70   Wt 218 lb 9.6 oz (99.2 kg)   SpO2 96%   BMI 31.37 kg/m  , BMI Body mass index is 31.37 kg/m.  Wt Readings from Last 3 Encounters:  01/26/24 218 lb 9.6 oz (99.2 kg)  04/19/23 213 lb (96.6 kg)  09/07/22 209 lb 3.2 oz (94.9 kg)     Affect appropriate Healthy:  appears stated age HEENT: normal Neck supple with no adenopathy JVP normal no bruits no thyromegaly Lungs clear with no wheezing and good diaphragmatic motion Heart:  S1/S2 no murmur,  no rub, gallop or click PMI normal Abdomen: benighn, BS positve, no tenderness, no AAA no bruit.  No HSM or HJR Distal pulses intact with no bruits No edema Neuro non-focal Skin warm and dry No muscular weakness  ECG: 01/26/2024  NSR rate 80 normal    Assessment & Plan    Minimal nonobstructive CAD, DOE No obstructive CAD continue ASA/statin   HTN Well controlled on losartan, diuretic and lopressor  BMET  HLD, hypertriglyceridemia Continue atorvastatin and fenofibrate.  Heart healthy diet and regular cardiovascular exercise encouraged. Update labs   Substance abuse Previously discussed to wean off alcohol and tobacco use.  Smoking cessation encouraged and discussed.  He verbalized understanding, planning to decrease usage.3/24 cardiac CT over read no lung cancer will order lung cancer CT scan  5.  Obesity, screening for diabetes mellitus Weight loss via diet and exercise encouraged.    Lung cancer CT Lipid/Liver and BMET   Disposition: Follow up in  a year  Signed, Maude Emmer, MD

## 2024-01-26 ENCOUNTER — Ambulatory Visit: Attending: Cardiovascular Disease | Admitting: Cardiovascular Disease

## 2024-01-26 VITALS — BP 134/70 | Wt 218.6 lb

## 2024-01-26 DIAGNOSIS — I2510 Atherosclerotic heart disease of native coronary artery without angina pectoris: Secondary | ICD-10-CM

## 2024-01-26 DIAGNOSIS — Z720 Tobacco use: Secondary | ICD-10-CM

## 2024-01-26 NOTE — Patient Instructions (Signed)
 Medication Instructions:   Your physician recommends that you continue on your current medications as directed. Please refer to the Current Medication list given to you today.   Labwork: Fasting Lipids,BMET,LFT's  Testing/Procedures: Schedule lung cancer chest ct screen  Follow-Up: 1 year  Any Other Special Instructions Will Be Listed Below (If Applicable).  If you need a refill on your cardiac medications before your next appointment, please call your pharmacy.

## 2024-03-01 ENCOUNTER — Ambulatory Visit (HOSPITAL_COMMUNITY)
Admission: RE | Admit: 2024-03-01 | Discharge: 2024-03-01 | Disposition: A | Discharge status: Home / Self Care | Source: Ambulatory Visit | Attending: Cardiovascular Disease | Admitting: Cardiovascular Disease

## 2024-03-01 DIAGNOSIS — Z720 Tobacco use: Secondary | ICD-10-CM | POA: Insufficient documentation

## 2024-03-06 ENCOUNTER — Ambulatory Visit: Payer: Self-pay | Admitting: Cardiovascular Disease

## 2024-03-29 ENCOUNTER — Ambulatory Visit: Admitting: General Surgery

## 2024-03-29 ENCOUNTER — Encounter: Payer: Self-pay | Admitting: General Surgery

## 2024-03-29 VITALS — BP 158/114 | HR 91 | Temp 98.1°F | Resp 16 | Ht 70.0 in | Wt 222.0 lb

## 2024-03-29 DIAGNOSIS — K625 Hemorrhage of anus and rectum: Secondary | ICD-10-CM

## 2024-03-29 DIAGNOSIS — R198 Other specified symptoms and signs involving the digestive system and abdomen: Secondary | ICD-10-CM

## 2024-03-29 NOTE — Patient Instructions (Addendum)
 We will look at your anus under anesthesia and see if there is any obvious signs of fistula or area of bleeding. If the hemorrhoids are large and look like they have been bleeding, I can remove this and if there is any fistula we may do a fistulotomy (opening it up) or do a seton placement.   Pending what we find will dictate time out of work, so we will not know if you will need the time out until we see what we find. If we do not find anything, then will not need a prolonged period out of work. If we end up and remove hemorrhoid tissue or fistula then will likely be 6-8 weeks out of work.   Given the rectal bleeding, I have sent Dr. Cindie a message about possible repeat colonoscopy given that it is new onset.

## 2024-03-29 NOTE — Progress Notes (Unsigned)
 [facility] History and Physical  Reason for Referral:*** Referring Physician: ***  Chief Complaint   Follow-up     Shannon Ritter is a 53 y.o. male.  HPI:   Discussed the use of AI scribe software for clinical note transcription with the patient, who gave verbal consent to proceed.  History of Present Illness      ***.  The *** started *** and has had a duration of ***.  It is associated with ***.  The *** is improved with ***, and is made worse with ***.    Quality*** Context***  Past Medical History:  Diagnosis Date   Angina pectoris    GERD (gastroesophageal reflux disease)    Hypertension     Past Surgical History:  Procedure Laterality Date   ANTERIOR CRUCIATE LIGAMENT REPAIR Right    CARDIAC CATHETERIZATION     COLONOSCOPY WITH PROPOFOL  N/A 01/25/2022   Procedure: COLONOSCOPY WITH PROPOFOL ;  Surgeon: Cindie Carlin POUR, DO;  [facility];  Service: Endoscopy;  Laterality: N/A;  11:00AM   FISTULOTOMY N/A 03/16/2021   Procedure: FISTULOTOMY VERSUS SETON;  Surgeon: Kallie Manuelita BROCKS, MD;  Location: [facility];  Service: General;  Laterality: N/A;   POLYPECTOMY  01/25/2022   Procedure: POLYPECTOMY;  Surgeon: Cindie Carlin POUR, DO;  [facility];  Service: Endoscopy;;    Family History  Problem Relation Age of Onset   Hyperlipidemia Mother    Hypertension Mother    Diabetes Mother    Cancer Father     Social History   Tobacco Use   Smoking status: Some Days    Current packs/day: 0.25    Average packs/day: 0.3 packs/day for 30.0 years (7.5 ttl pk-yrs)    Types: Cigarettes    Passive exposure: Never   Smokeless tobacco: Never  Vaping Use   Vaping status: Never Used  Substance Use Topics   Alcohol use: Yes    Alcohol/week: 36.0 standard drinks of alcohol    Types: 36 Cans of beer per week    Comment: 2-3 times week   Drug use: Never    Medications: {medication reviewed/display:3041432} Allergies as of 03/29/2024    No Known Allergies      Medication List        Accurate as of March 29, 2024  9:42 AM. If you have any questions, ask your nurse or doctor.          ALPRAZolam 1 MG tablet Commonly known as: XANAX Take 1 mg by mouth at bedtime.   aspirin 81 MG tablet Take 81 mg by mouth daily.   atorvastatin 20 MG tablet Commonly known as: LIPITOR Take 20 mg by mouth daily.   cetirizine 10 MG tablet Commonly known as: ZYRTEC Take 10 mg by mouth daily.   fenofibrate 160 MG tablet Take 160 mg by mouth daily.   FISH OIL PO Take 1 capsule by mouth daily.   hydrochlorothiazide 25 MG tablet Commonly known as: HYDRODIURIL Take 25 mg by mouth daily.   losartan 100 MG tablet Commonly known as: COZAAR Take 100 mg by mouth daily.   metoprolol  tartrate 100 MG tablet Commonly known as: LOPRESSOR  Take 1 tablet by mouth 2 hours prior to CT Scan.   MULTIVITAMIN ADULT PO Take 1 tablet by mouth daily.   nitroGLYCERIN  0.4 MG SL tablet Commonly known as: NITROSTAT  Place 1 tablet (0.4 mg total) under the tongue every 5 (five) minutes as needed for chest pain. If a single episode of  chest pain is not relieved by one tablet, the patient will try another within 5 minutes; and if this doesn't relieve the pain, the patient is instructed to call 911 for transportation to an emergency department.   pantoprazole 40 MG tablet Commonly known as: PROTONIX Take 40 mg by mouth daily.   VITAMIN C PO Take 1 tablet by mouth daily.         ROS:  {Review of Systems:30496}  Blood pressure (!) 158/114, pulse 91, temperature 98.1 F (36.7 C), temperature source Oral, resp. rate 16, height 5' 10 (1.778 m), weight 222 lb (100.7 kg), SpO2 92%. Physical Exam Physical Exam   Results: No results found for this or any previous visit (from the past 48 hours).  No results found.    Assessment & Plan      Shannon Ritter is a 53 y.o. male with *** -*** -*** -Follow up ***  All questions were  answered to the satisfaction of the patient and family***.  The risk and benefits of *** were discussed including but not limited to ***.  After careful consideration, Shannon Ritter has decided to ***.    [name] 03/29/2024, 9:42 AM

## 2024-03-30 NOTE — H&P (Signed)
 [facility] History and Physical  Reason for Referral: Anal discharge and rectal bleeding  Referring Physician: Marvine Rush, MD   Chief Complaint   Follow-up     CHENEY EWART is a 53 y.o. male.  HPI:   Discussed the use of AI scribe software for clinical note transcription with the patient, who gave verbal consent to proceed.  History of Present Illness ENCARNACION SCIONEAUX is a 53 year old male with a history of a anal fistula s/p fistulotomy in 2022 and known internal hemorrhoids who presents with rectal bleeding and discharge.  He has been experiencing rectal bleeding and discharge, with significant bleeding at times, including an episode last night where blood was found in his bed. The discharge is described as 'nasty,' occurring even without bowel movements, and sometimes appears as yellowy mucus or stool. This issue has persisted since 2022, following a superficial fistulotomy. He experiences minor discomfort but no significant pain, and no recent painful bowel movements or constipation. Bowel movements are regular and soft.  The patient recalls being told after a colonoscopy that he had polyps removed and was told he had diverticulosis. No family history of colon cancer is reported.  During the review of symptoms, he reports feeling unclean after using the bathroom, as discharge appears in his underwear an hour or two later. No significant abdominal pain, bloating, or hemorrhoids protruding.     Past Medical History:  Diagnosis Date   Angina pectoris    GERD (gastroesophageal reflux disease)    Hypertension     Past Surgical History:  Procedure Laterality Date   ANTERIOR CRUCIATE LIGAMENT REPAIR Right    CARDIAC CATHETERIZATION     COLONOSCOPY WITH PROPOFOL  N/A 01/25/2022   Procedure: COLONOSCOPY WITH PROPOFOL ;  Surgeon: Cindie Carlin POUR, DO;  [facility];  Service: Endoscopy;  Laterality: N/A;  11:00AM   FISTULOTOMY N/A 03/16/2021   Procedure:  FISTULOTOMY VERSUS SETON;  Surgeon: Kallie Manuelita BROCKS, MD;  Location: [facility];  Service: General;  Laterality: N/A;   POLYPECTOMY  01/25/2022   Procedure: POLYPECTOMY;  Surgeon: Cindie Carlin POUR, DO;  [facility];  Service: Endoscopy;;    Family History  Problem Relation Age of Onset   Hyperlipidemia Mother    Hypertension Mother    Diabetes Mother    Cancer Father     Social History   Tobacco Use   Smoking status: Some Days    Current packs/day: 0.25    Average packs/day: 0.3 packs/day for 30.0 years (7.5 ttl pk-yrs)    Types: Cigarettes    Passive exposure: Never   Smokeless tobacco: Never  Vaping Use   Vaping status: Never Used  Substance Use Topics   Alcohol use: Yes    Alcohol/week: 36.0 standard drinks of alcohol    Types: 36 Cans of beer per week    Comment: 2-3 times week   Drug use: Never    Medications: I have reviewed the patient's current medications. Allergies as of 03/29/2024   No Known Allergies      Medication List        Accurate as of March 29, 2024  9:42 AM. If you have any questions, ask your nurse or doctor.          ALPRAZolam 1 MG tablet Commonly known as: XANAX Take 1 mg by mouth at bedtime.   aspirin 81 MG tablet Take 81 mg by mouth daily.   atorvastatin 20 MG tablet Commonly known as: LIPITOR  Take 20 mg by mouth daily.   cetirizine 10 MG tablet Commonly known as: ZYRTEC Take 10 mg by mouth daily.   fenofibrate 160 MG tablet Take 160 mg by mouth daily.   FISH OIL PO Take 1 capsule by mouth daily.   hydrochlorothiazide 25 MG tablet Commonly known as: HYDRODIURIL Take 25 mg by mouth daily.   losartan 100 MG tablet Commonly known as: COZAAR Take 100 mg by mouth daily.   metoprolol  tartrate 100 MG tablet Commonly known as: LOPRESSOR  Take 1 tablet by mouth 2 hours prior to CT Scan.   MULTIVITAMIN ADULT PO Take 1 tablet by mouth daily.   nitroGLYCERIN  0.4 MG SL tablet Commonly known as:  NITROSTAT  Place 1 tablet (0.4 mg total) under the tongue every 5 (five) minutes as needed for chest pain. If a single episode of chest pain is not relieved by one tablet, the patient will try another within 5 minutes; and if this doesn't relieve the pain, the patient is instructed to call 911 for transportation to an emergency department.   pantoprazole 40 MG tablet Commonly known as: PROTONIX Take 40 mg by mouth daily.   VITAMIN C PO Take 1 tablet by mouth daily.         ROS:  A comprehensive review of systems was negative except for: Gastrointestinal: positive for rectal bleeding and discharge anus  Blood pressure (!) 158/114, pulse 91, temperature 98.1 F (36.7 C), temperature source Oral, resp. rate 16, height 5' 10 (1.778 m), weight 222 lb (100.7 kg), SpO2 92%.  Physical Exam GENERAL: Alert, cooperative, well developed, no acute distress. HEENT: Normocephalic, normal oropharynx, moist mucous membranes. CHEST: Clear to auscultation bilaterally, no wheezes, rhonchi, or crackles. CARDIOVASCULAR: Normal heart rate and rhythm ABDOMEN: Soft, non-tender, non-distended, without organomegaly, normal bowel sounds. RECTAL: Linear Skin tag present at left posterior anus. No fissure or tear. No other obvious abnormalities. Right posterior region of the prior fistulotomy healed no obvious issues. No masses on internal exam EXTREMITIES: No cyanosis or edema. NEUROLOGICAL: Cranial nerves grossly intact, moves all extremities without gross motor or sensory deficit.  Results: None     Assessment & Plan Rectal bleeding and anal discharge, evaluation post-fistulotomy Intermittent rectal bleeding recently and new onset, and anal discharge since 2022 post-fistulotomy. Differential includes internal hemorrhoids, colonic diverticula, or recurrent anal fistula. Last colonoscopy showed diverticula and internal hemorrhoids.  - Discussed potential need for exam under anesthesia to evaluate for  recurrent fistula or residual tissue changes. - Discussed given the new bleeding would likely need a repeat colonoscopy. He did have hemorrhoids on the last colonoscopy. Discussed if I found nothing I will do nothing during the exam under anesthesia versus remove the tag given his discharge and possible remove hemorrhoids. Discussed risk of bleeding, infection, injury to the muscle, nothing improving the discharge/ issues with cleanliness. - Discussed 6-8 weeks out of work pending the EUA      All questions were answered to the satisfaction of the patient.   [name] 03/29/2024, 9:42 AM

## 2024-03-30 NOTE — Patient Instructions (Signed)
 Shannon Ritter  03/30/2024     @PREFPERIOPPHARMACY @   Your procedure is scheduled on 04/04/2024.   Report to [facility] at  1000 A.M.    Call this number if you have problems the morning of surgery:  [phone]  If you experience any cold or flu symptoms such as cough, fever, chills, shortness of breath, etc. between now and your scheduled surgery, please notify us  at the above number.   Remember:  Do not eat after midnight.   You may drink clear liquids until 0800 am on 04/04/2024.     Clear liquids allowed are:                    Water, Carbonated beverages (diabetics please choose diet or no sugar options), Black Coffee Only (No creamer, milk or cream, including half & half and powdered creamer), and Clear Sports drink (No red color; diabetics please choose diet or no sugar options)    Take these medicines the morning of surgery with A SIP OF WATER                                              pantoprazole.    Do not wear jewelry, make-up or nail polish, including gel polish,  artificial nails, or any other type of covering on natural nails (fingers and  toes).  Do not wear lotions, powders, or perfumes, or deodorant.  Do not shave 48 hours prior to surgery.  Men may shave face and neck.  Do not bring valuables to the hospital.  [facility] is not responsible for any belongings or valuables.  Contacts, dentures or bridgework may not be worn into surgery.  Leave your suitcase in the car.  After surgery it may be brought to your room.  For patients admitted to the hospital, discharge time will be determined by your treatment team.  Patients discharged the day of surgery will not be allowed to drive home and must have someone with them for 24 hours.    Special instructions:  DO NOT smoke tobacco or vape for 24 hours before your procedure.  Please read over the following fact sheets that you were given. Coughing and Deep Breathing, Surgical Site Infection  Prevention, Anesthesia Post-op Instructions, and Care and Recovery After Surgery      Surgical Procedures for Hemorrhoids Surgery can be used to treat hemorrhoids. Hemorrhoids are swollen veins in and around the rectum or the opening of the butt (anus). There are two types: Internal. These occur in the veins just inside the rectum. They may poke through to the outside. External. These occur in the veins outside the anus. They can be felt as a swelling or hard lump near the anus. Hemorrhoids can cause bleeding and pain. They can often be treated at home. If home treatments do not help, you may need to have surgery. Types of surgery include: Closed hemorrhoidectomy. Open hemorrhoidectomy. Stapled hemorrhoidectomy. Tell a health care provider about: Any allergies you have. All medicines you are taking, including vitamins, herbs, eye drops, creams, and over-the-counter medicines. Any problems you or family members have had with anesthesia. Any bleeding problems you have. Any surgeries you have had. Any medical conditions you have. Whether you are pregnant or may be pregnant. What are the risks? Your health care provider  will talk with you about risks. These may include: Pain and bleeding. You may also have a hematoma. This is a collection of blood in the area where surgery was done. Constipation. You may also have fecal impaction. This is when poop gets trapped in the anal canal. Trouble peeing (urinating) or the inability to control when you pee or poop (incontinence). Stenosis. This is narrowing of the anal canal. Getting hemorrhoids again. A new passage (fistula) forming between the anus or rectum and somewhere else in the body. Rectal prolapse. This happens when the lining of the rectum slips out of the anus. What happens before the surgery? When to stop eating and drinking Follow instructions from your provider about what you may eat and drink. These may include: 8 hours before your  surgery Stop eating most foods. Do not eat meat, fried foods, or fatty foods. Eat only light foods, such as toast or crackers. All liquids are okay except energy drinks and alcohol. 6 hours before your surgery Stop eating. Drink only clear liquids, such as water, clear fruit juice, black coffee, plain tea, and sports drinks. Do not drink energy drinks or alcohol. 2 hours before your surgery Stop drinking all liquids. You may be allowed to take medicines with small sips of water. If you do not follow your provider's instructions, your surgery may be delayed or canceled. Medicines Ask your provider about: Changing or stopping your regular medicines. These include any diabetes medicines or blood thinners you take. Taking medicines such as aspirin and ibuprofen. These medicines can thin your blood. Do not take them unless your provider tells you to. Taking over-the-counter medicines, vitamins, herbs, and supplements. Surgery safety Ask your provider: What steps will be taken to help prevent infection. These steps may include: Washing skin with a soap that kills germs. Taking antibiotics. General instructions You may be told to take a medicine that helps you poop (laxative) and an enema. This is done to clean out your colon before surgery (bowel prep). If you will be going home right after the procedure, plan to have a responsible adult: Take you home from the hospital or clinic. You will not be allowed to drive. Care for you for the time you are told. What happens during the surgery? An IV will be inserted into one of your veins. You will be given: A sedative. This helps you relax. Anesthesia. This keeps you from feeling pain. It will make you fall asleep for surgery. A jelly may be put into your rectum. A short tube (anoscope) will be put into your rectum to look at the hemorrhoids. If you have an internal hemorrhoid, you may need a closed hemorrhoidectomy. For this surgery: Tools  will be used to open the tissue around the hemorrhoids. The blood supply to the hemorrhoids will be tied off. The hemorrhoids will be taken out. The tissue around the affected area will be closed with stitches (sutures) that your body can absorb. If you have an open hemorrhoidectomy: Tools will be used to remove the hemorrhoids. The incisions will be left open to heal without sutures. If you have hemorrhoids that stick out of your anus, you may need a stapled hemorrhoidectomy. For this surgery: A stapling device will be used to partly remove the hemorrhoids. The device will be put into your anus. It will take out a ring of tissue. This tissue will come from the hemorrhoids and from right above the hemorrhoids. The staples in the device will close the edges of the  tissue. This will cut off the blood supply to any bits of the hemorrhoids that are left. It will also pull the tissue back into place. The surgeries may vary among providers and hospitals. What happens after the surgery? Your blood pressure, heart rate, breathing rate, and blood oxygen level may be monitored until you leave the hospital or clinic. You will be given pain medicine as needed. This information is not intended to replace advice given to you by your health care provider. Make sure you discuss any questions you have with your health care provider. Document Revised: 12/25/2021 Document Reviewed: 12/25/2021 Elsevier Patient Education  2024 [employer]Anal Fistulotomy, Care After After an anal fistulotomy, it is common to have: Some pain, discomfort, and swelling. More pain when you poop. Some bleeding from the incision. Some leakage of poop (stool). Follow these instructions at home: Medicines Take over-the-counter and prescription medicines only as told by your health care provider. If you were prescribed antibiotics, take them as told by your provider. Do not stop using the antibiotic even if you start to feel  better. Ask your provider if the medicine prescribed to you requires you to avoid driving or using machinery. Incision care  Follow instructions from your provider about how to take care of your incision. Make sure you: Wash your hands with soap and water for at least 20 seconds before and after you remove your bandage (dressing). If soap and water are not available, use hand sanitizer. Remove your dressing as told by your provider. In some cases, you may be told not to remove your dressing. You may need to let it come out with your first poop after surgery. Keep the incision area clean and dry. Check your incision area every day for signs of infection. Check for: More redness, swelling, or pain. More fluid or blood. Warmth. Pus or a bad smell. Self-care  After you poop, clean the incision area. Use one of these methods: Gently wipe with a moist towelette. Gently wipe with mild soap and water. Take a shower. Take a sitz bath. This is a shallow, warm-water bath that attaches to the toilet bowl. You can also sit in a bathtub filled with warm water. If told, put ice on the incision area. Put ice in a plastic bag. Place a towel between your skin and the bag. Leave the ice on for 20 minutes, 2-3 times a day. If your skin turns bright red, remove the ice right away to prevent skin damage. The risk of damage is higher if you cannot feel pain, heat, or cold. Managing constipation To prevent or treat constipation, you may need to: Drink enough fluid to keep your pee (urine) pale yellow. Take over-the-counter or prescription medicines. Eat foods that are high in fiber, such as beans, whole grains, and fresh fruits and vegetables. Limit foods that are high in fat and processed sugars, such as fried or sweet foods. Activity If you were given a sedative during the procedure, it can affect you for several hours. Do not drive or operate machinery until your provider says that it is safe. Rest as  told by your provider. Do not sit for a long time without moving. Get up to take short walks every 1-2 hours. This will improve blood flow and breathing. Ask for help if you feel weak or unsteady. You may have to avoid lifting. Ask your provider how much you can safely lift. Return to your normal activities as told by your provider. Ask your provider  what activities are safe for you. General instructions Follow instructions from your provider about what you may eat and drink. Do not use any products that contain nicotine or tobacco. These products include cigarettes, chewing tobacco, and vaping devices, such as e-cigarettes. If you need help quitting, ask your provider. If you have bleeding from the incision, wear a pad to absorb blood. Change it often. Do not swim or use a hot tub until your provider approves. Your provider may give you more instructions. Make sure you know what you can and cannot do. Contact a health care provider if: You have any signs of infection. You have a fever or chills. You have swelling or tenderness near your groin. You cannot control when you pee (urinate) or poop (incontinence). You are leaking poop. You have trouble peeing. Your pain does not get better with medicine. Get help right away if: You have severe pain in your abdomen or near your incision. You have sudden chest pain. You become weak or you faint. You have bleeding from your incision that soaks 2 or more pads in 24 hours. These symptoms may be an emergency. Get help right away. Call 911. Do not wait to see if the symptoms will go away. Do not drive yourself to the hospital. This information is not intended to replace advice given to you by your health care provider. Make sure you discuss any questions you have with your health care provider. Document Revised: 03/09/2022 Document Reviewed: 03/09/2022 Elsevier Patient Education  2024 [employer].General Anesthesia, Adult, Care After The following  information offers guidance on how to care for yourself after your procedure. Your health care provider may also give you more specific instructions. If you have problems or questions, contact your health care provider. What can I expect after the procedure? After the procedure, it is common for people to: Have pain or discomfort at the IV site. Have nausea or vomiting. Have a sore throat or hoarseness. Have trouble concentrating. Feel cold or chills. Feel weak, sleepy, or tired (fatigue). Have soreness and body aches. These can affect parts of the body that were not involved in surgery. Follow these instructions at home: For the time period you were told by your health care provider:  Rest. Do not participate in activities where you could fall or become injured. Do not drive or use machinery. Do not drink alcohol. Do not take sleeping pills or medicines that cause drowsiness. Do not make important decisions or sign legal documents. Do not take care of children on your own. General instructions Drink enough fluid to keep your urine pale yellow. If you have sleep apnea, surgery and certain medicines can increase your risk for breathing problems. Follow instructions from your health care provider about wearing your sleep device: Anytime you are sleeping, including during daytime naps. While taking prescription pain medicines, sleeping medicines, or medicines that make you drowsy. Return to your normal activities as told by your health care provider. Ask your health care provider what activities are safe for you. Take over-the-counter and prescription medicines only as told by your health care provider. Do not use any products that contain nicotine or tobacco. These products include cigarettes, chewing tobacco, and vaping devices, such as e-cigarettes. These can delay incision healing after surgery. If you need help quitting, ask your health care provider. Contact a health care provider  if: You have nausea or vomiting that does not get better with medicine. You vomit every time you eat or drink. You have  pain that does not get better with medicine. You cannot urinate or have bloody urine. You develop a skin rash. You have a fever. Get help right away if: You have trouble breathing. You have chest pain. You vomit blood. These symptoms may be an emergency. Get help right away. Call 911. Do not wait to see if the symptoms will go away. Do not drive yourself to the hospital. Summary After the procedure, it is common to have a sore throat, hoarseness, nausea, vomiting, or to feel weak, sleepy, or fatigue. For the time period you were told by your health care provider, do not drive or use machinery. Get help right away if you have difficulty breathing, have chest pain, or vomit blood. These symptoms may be an emergency. This information is not intended to replace advice given to you by your health care provider. Make sure you discuss any questions you have with your health care provider. Document Revised: 07/31/2021 Document Reviewed: 07/31/2021 Elsevier Patient Education  2024 [employer] Inc.How to Use Chlorhexidine  at Home in the Shower Chlorhexidine  gluconate (CHG) is a germ-killing (antiseptic) wash that's used to clean the skin. It can get rid of the germs that normally live on the skin and can keep them away for about 24 hours. If you're having surgery, you may be told to shower with CHG at home the night before surgery. This can help lower your risk for infection. To use CHG wash in the shower, follow the steps below. Supplies needed: CHG body wash. Clean washcloth. Clean towel. How to use CHG in the shower Follow these steps unless you're told to use CHG in a different way: Start the shower. Use your normal soap and shampoo to wash your face and hair. Turn off the shower or move out of the shower stream. Pour CHG onto a clean washcloth. Do not use any type of brush  or rough sponge. Start at your neck, washing your body down to your toes. Make sure you: Wash the part of your body where the surgery will be done for at least 1 minute. Do not scrub. Do not use CHG on your head or face unless your health care provider tells you to. If it gets into your ears or eyes, rinse them well with water. Do not wash your genitals with CHG. Wash your back and under your arms. Make sure to wash skin folds. Let the CHG sit on your skin for 1-2 minutes or as long as told. Rinse your entire body in the shower, including all body creases and folds. Turn off the shower. Dry off with a clean towel. Do not put anything on your skin afterward, such as powder, lotion, or perfume. Put on clean clothes or pajamas. If it's the night before surgery, sleep in clean sheets. General tips Use CHG only as told, and follow the instructions on the label. Use the full amount of CHG as told. This is often one bottle. Do not smoke and stay away from flames after using CHG. Your skin may feel sticky after using CHG. This is normal. The sticky feeling will go away as the CHG dries. Do not use CHG: If you have a chlorhexidine  allergy or have reacted to chlorhexidine  in the past. On open wounds or areas of skin that have broken skin, cuts, or scrapes. On babies younger than 44 months of age. Contact a health care provider if: You have questions about using CHG. Your skin gets irritated or itchy. You have a rash after  using CHG. You swallow any CHG. Call your local poison control center [phone] in the [place] Your eyes itch badly, or they become very red or swollen. Your hearing changes. You have trouble seeing. If you can't reach your provider, go to an urgent care or emergency room. Do not drive yourself. Get help right away if: You have swelling or tingling in your mouth or throat. You make high-pitched whistling sounds when you breathe, most often when you breathe out  (wheeze). You have trouble breathing. These symptoms may be an emergency. Call 911 right away. Do not wait to see if the symptoms will go away. Do not drive yourself to the hospital. This information is not intended to replace advice given to you by your health care provider. Make sure you discuss any questions you have with your health care provider. Document Revised: 11/16/2022 Document Reviewed: 11/12/2021 Elsevier Patient Education  2024 [employer].

## 2024-04-03 ENCOUNTER — Ambulatory Visit: Admitting: Gastroenterology

## 2024-04-03 ENCOUNTER — Other Ambulatory Visit: Payer: Self-pay

## 2024-04-03 ENCOUNTER — Encounter (HOSPITAL_COMMUNITY)
Admission: RE | Admit: 2024-04-03 | Discharge: 2024-04-03 | Disposition: A | Discharge status: Home / Self Care | Source: Ambulatory Visit | Attending: General Surgery | Admitting: General Surgery

## 2024-04-03 ENCOUNTER — Encounter (HOSPITAL_COMMUNITY): Payer: Self-pay

## 2024-04-03 ENCOUNTER — Encounter: Payer: Self-pay | Admitting: Gastroenterology

## 2024-04-03 VITALS — BP 139/100 | HR 108 | Temp 97.8°F | Ht 70.0 in | Wt 216.3 lb

## 2024-04-03 VITALS — BP 130/88 | HR 91 | Resp 18 | Ht 70.0 in | Wt 222.0 lb

## 2024-04-03 DIAGNOSIS — K219 Gastro-esophageal reflux disease without esophagitis: Secondary | ICD-10-CM

## 2024-04-03 DIAGNOSIS — Z79899 Other long term (current) drug therapy: Secondary | ICD-10-CM | POA: Insufficient documentation

## 2024-04-03 DIAGNOSIS — Z01812 Encounter for preprocedural laboratory examination: Secondary | ICD-10-CM | POA: Diagnosis present

## 2024-04-03 DIAGNOSIS — K625 Hemorrhage of anus and rectum: Secondary | ICD-10-CM | POA: Diagnosis not present

## 2024-04-03 DIAGNOSIS — R7303 Prediabetes: Secondary | ICD-10-CM | POA: Diagnosis not present

## 2024-04-03 DIAGNOSIS — R7989 Other specified abnormal findings of blood chemistry: Secondary | ICD-10-CM | POA: Diagnosis not present

## 2024-04-03 HISTORY — DX: Prediabetes: R73.03

## 2024-04-03 LAB — BASIC METABOLIC PANEL WITH GFR
Anion gap: 13 (ref 5–15)
BUN: 24 mg/dL — ABNORMAL HIGH (ref 6–20)
CO2: 24 mmol/L (ref 22–32)
Calcium: 10 mg/dL (ref 8.9–10.3)
Chloride: 98 mmol/L (ref 98–111)
Creatinine, Ser: 0.85 mg/dL (ref 0.61–1.24)
GFR, Estimated: >60 mL/min (ref >60)
Glucose, Bld: 140 mg/dL — ABNORMAL HIGH (ref 70–99)
Potassium: 3.9 mmol/L (ref 3.5–5.1)
Sodium: 135 mmol/L (ref 135–145)

## 2024-04-03 LAB — HEMOGLOBIN A1C
Hgb A1c MFr Bld: 6.3 % — ABNORMAL HIGH (ref 4.8–5.6)
Mean Plasma Glucose: 134.11 mg/dL

## 2024-04-03 NOTE — Patient Instructions (Signed)
 I will touch base with Dr. Kallie regarding your procedure.  At some point, we will arrange a colonoscopy and upper endoscopy with Dr. Cindie.  I have ordered an ultrasound of your liver, and further serologies due to your family history and chronically mildly elevated liver numbers.  I recommend avoidance of alcohol moving forward.  We will see you in 3 months for liver follow-up!  It was a pleasure to see you today. I want to create trusting relationships with patients and provide genuine, compassionate, and quality care. I truly value your feedback, so please be on the lookout for a survey regarding your visit with me today. I appreciate your time in completing this!         Therisa MICAEL Stager, PhD, ANP-BC [facility]

## 2024-04-03 NOTE — Progress Notes (Signed)
 Gastroenterology Office Note    Referring Provider: Marvine Rush, MD Primary Care Physician:  Marvine Rush, MD  Primary GI: Dr Cindie   Chief Complaint   Chief Complaint  Patient presents with   Rectal Bleeding    Patient here today due to having some rectal bleeding on going for a while. He says he sees this a couple of times a week. He says there is blood in the stool, and toilet. He has woken to blood in his bed. He has a history of a fissure. Has a surgery tomorrow with Manuelita Pander to look at the area.      History of Present Illness   Shannon Ritter is a 54 y.o. male presenting today at the request of Dr. Pander due to ongoing rectal bleeding. He is actually undergoing hemorrhoidectomy and exam under anesthesia by Dr. Pander. GI has been consulted to be on board for colonoscopy in future if needed.   Last colonoscopy Sept 2023: Preparation of the colon was fair.                           - Non-bleeding internal hemorrhoids.                           - Diverticulosis in the sigmoid colon.                           - One 6 mm polyp in the cecum, removed with a cold                            snare. Resected and retrieved.                           - The examination was otherwise normal. Sessile serrated polyp  Woke with blood in the bed one time. Intermittent low volume bleeding. Occasional pain with BM, like burning. No prolapsing tissue. Looser stool a few times a week alternating with normal BMs. No abdominal pain. No weight loss or lack of appetite.   Bleeding has been happening for about a year. No NSAIDs.   Pantoprazole once daily controls GERD. No dysphagia.   Left sided chest discomfort at times, saw cardiology. Constant. No worsening with eating. Notring relieves. No dysphagia.    No prior EGD.   Father: alpha 1 antitrypsin deficiency s/p double lung transplant.    Past Medical History:  Diagnosis Date   Angina pectoris    GERD (gastroesophageal  reflux disease)    Hypertension    Pre-diabetes     Past Surgical History:  Procedure Laterality Date   ANTERIOR CRUCIATE LIGAMENT REPAIR Right    CARDIAC CATHETERIZATION     COLONOSCOPY WITH PROPOFOL  N/A 01/25/2022   Procedure: COLONOSCOPY WITH PROPOFOL ;  Surgeon: Cindie Carlin POUR, DO;  [facility];  Service: Endoscopy;  Laterality: N/A;  11:00AM   FISTULOTOMY N/A 03/16/2021   Procedure: FISTULOTOMY VERSUS SETON;  Surgeon: Pander Manuelita BROCKS, MD;  Location: [facility];  Service: General;  Laterality: N/A;   POLYPECTOMY  01/25/2022   Procedure: POLYPECTOMY;  Surgeon: Cindie Carlin POUR, DO;  Location: [facility];  Service: Endoscopy;;    Current Outpatient Medications  Medication Sig Dispense Refill   ALPRAZolam (XANAX) 1 MG tablet Take 1 mg by  mouth at bedtime as needed for sleep.     Ascorbic Acid (VITAMIN C PO) Take 1 tablet by mouth daily.     aspirin 81 MG tablet Take 81 mg by mouth daily.     atorvastatin (LIPITOR) 20 MG tablet Take 20 mg by mouth daily.      cetirizine (ZYRTEC) 10 MG tablet Take 10 mg by mouth daily.     fenofibrate 160 MG tablet Take 160 mg by mouth daily.     hydrochlorothiazide (HYDRODIURIL) 25 MG tablet Take 25 mg by mouth daily.     losartan  (COZAAR ) 100 MG tablet Take 100 mg by mouth daily.     MAGNESIUM PO Take 1 tablet by mouth at bedtime.     melatonin 3 MG TABS tablet Take 3 mg by mouth at bedtime.     Multiple Vitamins-Minerals (MULTIVITAMIN ADULT PO) Take 1 tablet by mouth daily.     mupirocin ointment (BACTROBAN) 2 % Apply 1 Application topically 3 (three) times daily as needed (face).     nitroGLYCERIN  (NITROSTAT ) 0.4 MG SL tablet Place 1 tablet (0.4 mg total) under the tongue every 5 (five) minutes as needed for chest pain. If a single episode of chest pain is not relieved by one tablet, the patient will try another within 5 minutes; and if this doesn't relieve the pain, the patient is instructed to call 911 for transportation to an  emergency department. 25 tablet 3   Omega-3 Fatty Acids (FISH OIL PO) Take 1 capsule by mouth daily.     pantoprazole (PROTONIX) 40 MG tablet Take 40 mg by mouth daily.     tadalafil (CIALIS) 5 MG tablet Take 5 mg by mouth daily as needed.     tirzepatide (ZEPBOUND) 2.5 MG/0.5ML Pen Inject 2.5 mg into the skin once a week. (Patient not taking: Reported on 04/03/2024)     No current facility-administered medications for this visit.    Allergies as of 04/03/2024   (No Known Allergies)    Family History  Problem Relation Age of Onset   Hyperlipidemia Mother    Hypertension Mother    Diabetes Mother    Cancer Father     Social History   Socioeconomic History   Marital status: Married    Spouse name: Not on file   Number of children: Not on file   Years of education: Not on file   Highest education level: Not on file  Occupational History   Not on file  Tobacco Use   Smoking status: Some Days    Current packs/day: 0.25    Average packs/day: 0.3 packs/day for 30.0 years (7.5 ttl pk-yrs)    Types: Cigarettes    Passive exposure: Never   Smokeless tobacco: Never  Vaping Use   Vaping status: Never Used  Substance and Sexual Activity   Alcohol use: Yes    Alcohol/week: 36.0 standard drinks of alcohol    Types: 36 Cans of beer per week    Comment: 2-3 times week   Drug use: Never   Sexual activity: Yes  Other Topics Concern   Not on file  Social History Narrative   Not on file   Social Drivers of Health   Financial Resource Strain: Not on file  Food Insecurity: Not on file  Transportation Needs: Not on file  Physical Activity: Not on file  Stress: Not on file  Social Connections: Unknown (09/29/2021)   Received from [employer]   Social Network    Social Network:  Not on file  Intimate Partner Violence: Unknown (08/21/2021)   [facility]   HITS    Physically Hurt: Not on file    Insult or Talk Down To: Not on file    Threaten Physical Harm: Not  on file    Scream or Curse: Not on file     Review of Systems   Gen: Denies any fever, chills, fatigue, weight loss, lack of appetite.  CV: Denies chest pain, heart palpitations, peripheral edema, syncope.  Resp: Denies shortness of breath at rest or with exertion. Denies wheezing or cough.  GI: Denies dysphagia or odynophagia. Denies jaundice, hematemesis, fecal incontinence. GU : Denies urinary burning, urinary frequency, urinary hesitancy MS: Denies joint pain, muscle weakness, cramps, or limitation of movement.  Derm: Denies rash, itching, dry skin Psych: Denies depression, anxiety, memory loss, and confusion Heme: Denies bruising, bleeding, and enlarged lymph nodes.   Physical Exam   BP (!) 139/100 (BP Location: Right Arm, Patient Position: Sitting, Cuff Size: Normal)   Pulse (!) 108   Temp 97.8 F (36.6 C) (Temporal)   Ht 5' 10 (1.778 m)   Wt 216 lb 4.8 oz (98.1 kg)   BMI 31.04 kg/m  General:   Alert and oriented. Pleasant and cooperative. Well-nourished and well-developed.  Head:  Normocephalic and atraumatic. Eyes:  Without icterus Ears:  Normal auditory acuity. Lungs:  Clear to auscultation bilaterally.  Heart:  S1, S2 present without murmurs appreciated.  Abdomen:  +BS, soft, non-tender and non-distended. No HSM noted. No guarding or rebound. No masses appreciated.  Rectal:  Deferred  Msk:  Symmetrical without gross deformities. Normal posture. Extremities:  Without edema. Neurologic:  Alert and  oriented x4;  grossly normal neurologically. Skin:  Intact without significant lesions or rashes. Psych:  Alert and cooperative. Normal mood and affect.  Outside labs: intermittently mildly elevated ALT upon review of labs over the years  Labs Oct 2025 from PCP: Hgb 15.4, Platelets 261, Tbili 0.6, Alk Phos 46, AST 29, ALT 49, Creatinine 0.76, cholesterol 198, HDL 41, Trigylcerides 221, LDL 118, A1c 6.5.    Assessment/Plan   Rectal bleeding: with upcoming  hemorrhoidectomy and exam under anesthesia. Hx of sessile serrated polyp in 2023. Suspect bleeding due to benign anorectal source. Regardless, recommend colonoscopy in future once he has fully recovered. Will need to coordinate with surgery.  Elevated ALT: chronically. Father with history of alpha 1 antitrypsin deficiency s/p double lung transplant in past. 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 Thorough serologies now, add ELF, check US  abdomen. Avoid alcohol moving forward  GERD: chronic. No prior EGD. Recommend screening EGD at time of colonoscopy once cleared from surgical standpoint.   3 month follow-up regardless.    Therisa MICAEL Stager, PhD, ANP-BC [facility]

## 2024-04-03 NOTE — Progress Notes (Signed)
   04/03/24 1437  OBSTRUCTIVE SLEEP APNEA  Have you ever been diagnosed with sleep apnea through a sleep study? No  Do you snore loudly (loud enough to be heard through closed doors)?  1  Do you often feel tired, fatigued, or sleepy during the daytime (such as falling asleep during driving or talking to someone)? 0  Has anyone observed you stop breathing during your sleep? 1  Do you have, or are you being treated for high blood pressure? 1  BMI more than 35 kg/m2? 0  Age > 50 (1-yes) 1  Neck circumference greater than:Male 16 inches or larger, Male 17inches or larger? 0  Male Gender (Yes=1) 1  Obstructive Sleep Apnea Score 5  Score 5 or greater  Results sent to PCP

## 2024-04-04 ENCOUNTER — Ambulatory Visit (HOSPITAL_COMMUNITY): Admitting: Certified Registered"

## 2024-04-04 ENCOUNTER — Encounter (HOSPITAL_COMMUNITY)
Admission: RE | Disposition: A | Discharge status: Home / Self Care | Payer: Self-pay | Source: Home / Self Care | Attending: General Surgery

## 2024-04-04 ENCOUNTER — Encounter (HOSPITAL_COMMUNITY): Payer: Self-pay | Admitting: General Surgery

## 2024-04-04 ENCOUNTER — Encounter (INDEPENDENT_AMBULATORY_CARE_PROVIDER_SITE_OTHER): Payer: Self-pay | Admitting: *Deleted

## 2024-04-04 ENCOUNTER — Ambulatory Visit (HOSPITAL_COMMUNITY)
Admission: RE | Admit: 2024-04-04 | Discharge: 2024-04-04 | Disposition: A | Discharge status: Home / Self Care | Attending: General Surgery | Admitting: General Surgery

## 2024-04-04 DIAGNOSIS — K603 Anal fistula, unspecified: Secondary | ICD-10-CM | POA: Diagnosis not present

## 2024-04-04 DIAGNOSIS — K641 Second degree hemorrhoids: Secondary | ICD-10-CM | POA: Diagnosis not present

## 2024-04-04 DIAGNOSIS — R198 Other specified symptoms and signs involving the digestive system and abdomen: Secondary | ICD-10-CM | POA: Diagnosis present

## 2024-04-04 DIAGNOSIS — Z79899 Other long term (current) drug therapy: Secondary | ICD-10-CM | POA: Diagnosis not present

## 2024-04-04 DIAGNOSIS — R7303 Prediabetes: Secondary | ICD-10-CM | POA: Diagnosis not present

## 2024-04-04 DIAGNOSIS — K219 Gastro-esophageal reflux disease without esophagitis: Secondary | ICD-10-CM | POA: Diagnosis not present

## 2024-04-04 DIAGNOSIS — I2510 Atherosclerotic heart disease of native coronary artery without angina pectoris: Secondary | ICD-10-CM | POA: Diagnosis not present

## 2024-04-04 DIAGNOSIS — K625 Hemorrhage of anus and rectum: Secondary | ICD-10-CM | POA: Diagnosis not present

## 2024-04-04 DIAGNOSIS — F17210 Nicotine dependence, cigarettes, uncomplicated: Secondary | ICD-10-CM | POA: Insufficient documentation

## 2024-04-04 DIAGNOSIS — I10 Essential (primary) hypertension: Secondary | ICD-10-CM | POA: Insufficient documentation

## 2024-04-04 HISTORY — PX: RECTAL EXAM UNDER ANESTHESIA: SHX6399

## 2024-04-04 HISTORY — PX: HEMORRHOID SURGERY: SHX153

## 2024-04-04 LAB — GLUCOSE, CAPILLARY: Glucose-Capillary: 139 mg/dL — ABNORMAL HIGH (ref 70–99)

## 2024-04-04 SURGERY — HEMORRHOIDECTOMY
Anesthesia: General | Site: Rectum

## 2024-04-04 MED ORDER — LIDOCAINE VISCOUS HCL 2 % MT SOLN
OROMUCOSAL | Status: AC
  Filled 2024-04-04: qty 15

## 2024-04-04 MED ORDER — LABETALOL HCL 5 MG/ML IV SOLN
INTRAVENOUS | Status: AC
  Filled 2024-04-04: qty 4

## 2024-04-04 MED ORDER — FENTANYL CITRATE (PF) 100 MCG/2ML IJ SOLN
INTRAMUSCULAR | Status: AC
  Filled 2024-04-04: qty 2

## 2024-04-04 MED ORDER — SODIUM CHLORIDE 0.9 % IV SOLN
2.0000 g | INTRAVENOUS | Status: AC
  Administered 2024-04-04: 2 g via INTRAVENOUS
  Filled 2024-04-04: qty 2

## 2024-04-04 MED ORDER — PROPOFOL 10 MG/ML IV BOLUS
INTRAVENOUS | Status: DC | PRN
  Administered 2024-04-04: 150 mg via INTRAVENOUS
  Administered 2024-04-04: 50 mg via INTRAVENOUS

## 2024-04-04 MED ORDER — CHLORHEXIDINE GLUCONATE 0.12 % MT SOLN: 15.0000 mL | Freq: Once | OROMUCOSAL | Status: DC

## 2024-04-04 MED ORDER — BUPIVACAINE HCL (PF) 0.5 % IJ SOLN
INTRAMUSCULAR | Status: AC
  Filled 2024-04-04: qty 30

## 2024-04-04 MED ORDER — LIDOCAINE VISCOUS HCL 2 % MT SOLN
OROMUCOSAL | Status: DC | PRN
  Administered 2024-04-04: 1

## 2024-04-04 MED ORDER — LIDOCAINE 2% (20 MG/ML) 5 ML SYRINGE
INTRAMUSCULAR | Status: DC | PRN
  Administered 2024-04-04: 50 mg via INTRAVENOUS

## 2024-04-04 MED ORDER — GLYCOPYRROLATE PF 0.2 MG/ML IJ SOSY
PREFILLED_SYRINGE | INTRAMUSCULAR | Status: AC
  Filled 2024-04-04: qty 1

## 2024-04-04 MED ORDER — MIDAZOLAM HCL 5 MG/5ML IJ SOLN
INTRAMUSCULAR | Status: DC | PRN
  Administered 2024-04-04: 2 mg via INTRAVENOUS

## 2024-04-04 MED ORDER — LACTATED RINGERS IV SOLN: INTRAVENOUS | Status: DC

## 2024-04-04 MED ORDER — ONDANSETRON HCL 4 MG PO TABS: 4.0000 mg | ORAL_TABLET | Freq: Three times a day (TID) | ORAL | 1 refills | Status: AC | PRN

## 2024-04-04 MED ORDER — FENTANYL CITRATE (PF) 50 MCG/ML IJ SOSY: 25.0000 ug | PREFILLED_SYRINGE | INTRAMUSCULAR | Status: DC | PRN

## 2024-04-04 MED ORDER — PHENYLEPHRINE 80 MCG/ML (10ML) SYRINGE FOR IV PUSH (FOR BLOOD PRESSURE SUPPORT)
PREFILLED_SYRINGE | INTRAVENOUS | Status: AC
  Filled 2024-04-04: qty 10

## 2024-04-04 MED ORDER — LIDOCAINE 2% (20 MG/ML) 5 ML SYRINGE
INTRAMUSCULAR | Status: AC
Start: 2024-04-04 — End: 2024-04-04
  Filled 2024-04-04: qty 5

## 2024-04-04 MED ORDER — SODIUM CHLORIDE 0.9 % IV SOLN: 12.5000 mg | INTRAVENOUS | Status: DC | PRN

## 2024-04-04 MED ORDER — ONDANSETRON HCL 4 MG/2ML IJ SOLN
INTRAMUSCULAR | Status: DC | PRN
  Administered 2024-04-04: 4 mg via INTRAVENOUS

## 2024-04-04 MED ORDER — BUPIVACAINE HCL (PF) 0.5 % IJ SOLN
INTRAMUSCULAR | Status: DC | PRN
  Administered 2024-04-04: 30 mL

## 2024-04-04 MED ORDER — FENTANYL CITRATE (PF) 100 MCG/2ML IJ SOLN
INTRAMUSCULAR | Status: DC | PRN
  Administered 2024-04-04 (×2): 50 ug via INTRAVENOUS

## 2024-04-04 MED ORDER — SODIUM CHLORIDE 0.9 % IR SOLN
Status: DC | PRN
  Administered 2024-04-04: 500 mL

## 2024-04-04 MED ORDER — MIDAZOLAM HCL 2 MG/2ML IJ SOLN
INTRAMUSCULAR | Status: AC
  Filled 2024-04-04: qty 2

## 2024-04-04 MED ORDER — DEXAMETHASONE SOD PHOSPHATE PF 10 MG/ML IJ SOLN
INTRAMUSCULAR | Status: DC | PRN
  Administered 2024-04-04: 10 mg via INTRAVENOUS

## 2024-04-04 MED ORDER — OXYCODONE HCL 5 MG PO TABS: 5.0000 mg | ORAL_TABLET | ORAL | 0 refills | Status: AC | PRN

## 2024-04-04 MED ORDER — PHENYLEPHRINE 80 MCG/ML (10ML) SYRINGE FOR IV PUSH (FOR BLOOD PRESSURE SUPPORT)
PREFILLED_SYRINGE | INTRAVENOUS | Status: DC | PRN
  Administered 2024-04-04 (×3): 160 ug via INTRAVENOUS

## 2024-04-04 MED ORDER — ONDANSETRON HCL 4 MG/2ML IJ SOLN
INTRAMUSCULAR | Status: AC
  Filled 2024-04-04: qty 2

## 2024-04-04 MED ORDER — OXYCODONE HCL 5 MG/5ML PO SOLN
5.0000 mg | Freq: Once | ORAL | Status: AC | PRN
  Administered 2024-04-04: 5 mg via ORAL
  Filled 2024-04-04: qty 5

## 2024-04-04 MED ORDER — OXYCODONE HCL 5 MG PO TABS: 5.0000 mg | ORAL_TABLET | Freq: Once | ORAL | Status: AC | PRN

## 2024-04-04 MED ORDER — LABETALOL HCL 5 MG/ML IV SOLN
5.0000 mg | INTRAVENOUS | Status: DC | PRN
  Administered 2024-04-04 (×2): 5 mg via INTRAVENOUS

## 2024-04-04 MED ORDER — LOSARTAN POTASSIUM 50 MG PO TABS
100.0000 mg | ORAL_TABLET | Freq: Once | ORAL | Status: AC
  Administered 2024-04-04: 100 mg via ORAL
  Filled 2024-04-04: qty 2

## 2024-04-04 MED ORDER — ORAL CARE MOUTH RINSE: 15.0000 mL | Freq: Once | OROMUCOSAL | Status: DC

## 2024-04-04 MED ORDER — PROPOFOL 10 MG/ML IV BOLUS
INTRAVENOUS | Status: AC
  Filled 2024-04-04: qty 20

## 2024-04-04 MED ORDER — CHLORHEXIDINE GLUCONATE CLOTH 2 % EX PADS: 6.0000 | MEDICATED_PAD | Freq: Once | CUTANEOUS | Status: DC

## 2024-04-04 SURGICAL SUPPLY — 29 items
BAG HAMPER (MISCELLANEOUS) ×3 IMPLANT
CANNULA VESSEL 3MM 2 BLNT TIP (CANNULA) ×3 IMPLANT
CLOTH BEACON ORANGE TIMEOUT ST (SAFETY) ×3 IMPLANT
COVER LIGHT HANDLE (MISCELLANEOUS) IMPLANT
DISSECTOR SURG LIGASURE 21 (MISCELLANEOUS) ×3 IMPLANT
DRAPE HALF SHEET 40X57 (DRAPES) ×6 IMPLANT
DRSG TEGADERM 4X10 (GAUZE/BANDAGES/DRESSINGS) IMPLANT
ELECTRODE REM PT RTRN 9FT ADLT (ELECTROSURGICAL) ×3 IMPLANT
GAUZE 4X4 16PLY ~~LOC~~+RFID DBL (SPONGE) ×3 IMPLANT
GAUZE SPONGE 4X4 12PLY STRL (GAUZE/BANDAGES/DRESSINGS) ×6 IMPLANT
GLOVE BIO SURGEON STRL SZ 6.5 (GLOVE) ×3 IMPLANT
GLOVE BIOGEL M 7.0 STRL (GLOVE) ×3 IMPLANT
GLOVE BIOGEL PI IND STRL 6.5 (GLOVE) ×3 IMPLANT
GLOVE BIOGEL PI IND STRL 7.0 (GLOVE) ×6 IMPLANT
GOWN STRL REUS W/TWL LRG LVL3 (GOWN DISPOSABLE) ×6 IMPLANT
HEMOSTAT SURGICEL 4X8 (HEMOSTASIS) ×3 IMPLANT
KIT TURNOVER CYSTO (KITS) ×3 IMPLANT
MANIFOLD NEPTUNE II (INSTRUMENTS) ×3 IMPLANT
NDL HYPO 21X1.5 SAFETY (NEEDLE) ×3 IMPLANT
NEEDLE HYPO 21X1.5 SAFETY (NEEDLE) ×3 IMPLANT
PACK PERI GYN (CUSTOM PROCEDURE TRAY) ×3 IMPLANT
PAD ARMBOARD POSITIONER FOAM (MISCELLANEOUS) ×3 IMPLANT
POSITIONER HEAD 8X9X4 ADT (SOFTGOODS) ×3 IMPLANT
SET BASIN LINEN APH (SET/KITS/TRAYS/PACK) ×3 IMPLANT
SOLN 0.9% NACL POUR BTL 1000ML (IV SOLUTION) ×3 IMPLANT
SPONGE SURGIFOAM ABS GEL 100 (HEMOSTASIS) ×3 IMPLANT
SURGILUBE 2OZ TUBE FLIPTOP (MISCELLANEOUS) ×3 IMPLANT
SUT SILK 0 FSL (SUTURE) ×3 IMPLANT
SYR 30ML LL (SYRINGE) ×9 IMPLANT

## 2024-04-04 NOTE — Anesthesia Postprocedure Evaluation (Signed)
 Anesthesia Post Note  Patient: Shannon Ritter  Procedure(s) Performed: EXTENSIVE HEMORRHOIDECTOMY AND HEMORRHOIDAL SKIN TAG REMOVAL (Rectum) EXAM UNDER ANESTHESIA, RECTUM (Rectum)  Patient location during evaluation: PACU Anesthesia Type: General Level of consciousness: awake and alert Pain management: pain level controlled Vital Signs Assessment: post-procedure vital signs reviewed and stable Respiratory status: spontaneous breathing, nonlabored ventilation, respiratory function stable and patient connected to nasal cannula oxygen Cardiovascular status: blood pressure returned to baseline and stable Postop Assessment: no apparent nausea or vomiting Anesthetic complications: no   There were no known notable events for this encounter.   Last Vitals:  Vitals:   04/04/24 1327 04/04/24 1328  BP:  (!) 122/94  Pulse: 86   Resp: 14   Temp: 36.4 C   SpO2:      Last Pain:  Vitals:   04/04/24 1327  TempSrc: Oral  PainSc: 0-No pain                 Leeloo Silverthorne L Johnathon Olden

## 2024-04-04 NOTE — Anesthesia Procedure Notes (Signed)
 Procedure Name: LMA Insertion Date/Time: 04/04/2024 11:46 AM  Performed by: Jullie Redell SAUNDERS, CRNAPre-anesthesia Checklist: Patient identified, Emergency Drugs available, Suction available and Patient being monitored Patient Re-evaluated:Patient Re-evaluated prior to induction Oxygen Delivery Method: Circle system utilized Preoxygenation: Pre-oxygenation with 100% oxygen Induction Type: IV induction LMA: LMA with gastric port inserted LMA Size: 5.0 Tube type: Oral Number of attempts: 1 Placement Confirmation: positive ETCO2 and breath sounds checked- equal and bilateral Tube secured with: Tape Dental Injury: Teeth and Oropharynx as per pre-operative assessment

## 2024-04-04 NOTE — Discharge Instructions (Addendum)
 Discharge Instructions: Please take your roxicodone  as prescribed and alternate with tylenol  every 4-6 hours.   Do not take any aspirin or NSAIDs, ibuprofen, aleve, BC powder for 5 days.  After 04/09/24 you can start taking these medications again.  I recommend doing sitz baths every 4-6 hours for the first 5 days and after any Bms. If you do not do a sitz bath after BM, then get in the shower and clean off instead of wiping.  After the first 5 days,can do sitz baths 2-3 times a day and/or after Bms.  Please keep the area clean and dry and take Sitz baths (swallow warm water baths) for comfort and after bowel movements.  If you cannot get in a bath tub, you can purchase a Sitz bath that goes on the toilet at the pharmacy.  Please keep your stools soft and take fiber daily (metamucil) and colace (over the counter) to help prevent constipation.  If you have not had a BM in 2 days, please take Miralax, and if you have not had a BM after this, please notify Dr. Kallie.  Expect some bleeding following the hemorrhoid surgery and significant pain.  Go to the ED with extensive bleeding (soaking 2 large pads in < 1 hour), fevers, or chills.    Shower per your regular routine daily.   Rest and listen to your body, but do not remain in bed all day.  Walk everyday for at least 15-20 minutes. Deep cough and move around every 1-2 hours in the first few days after surgery.  Do not lift > 10 lbs, perform excessive bending, pushing, pulling, squatting. You will have pressure and pain at your anus and this is common. You may want to obtain a donut pillow from your pharmacy to sit on.   Some nausea is common and poor appetite. The main goal is to stay hydrated the first few days after surgery.   Pain Expectations and Narcotics: -After surgery you will have pain associated with your surgery and this is normal. The pain is muscular and nerve pain, and will get better with time. -You are encouraged and expected to  take non narcotic medications like tylenol  and ibuprofen (when able) to treat pain as multiple modalities can aid with pain treatment. -Narcotics are only used when pain is severe or there is breakthrough pain. -You are not expected to have a pain score of 0 after surgery, as we cannot prevent pain. A pain score of 3-4 that allows you to be functional, move, walk, and tolerate some activity is the goal. The pain will continue to improve over the days after surgery and is dependent on your surgery. -Due to [place] law, we are only able to give a certain amount of pain medication to treat post operative pain, and we only give additional narcotics on a patient by patient basis.  -For most laparoscopic surgery, studies have shown that the majority of patients only need 10-15 narcotic pills, and for open surgeries or anal surgeries most patients only need 15-20.   -Having appropriate expectations of pain and knowledge of pain management with non narcotics is important as we do not want anyone to become addicted to narcotic pain medication.  -Doing your Sitz baths will help with pain and pressure discomfort.  -Simple acts like meditation and mindfulness practices after surgery can also help with pain control and research has proven the benefit of these practices.  Contact Information: If you have questions or concerns, please call our  office, [phone], Monday- Thursday 8AM-5PM and Friday 8AM-12Noon.  If it is after hours or on the weekend, please call [facility] Main Number, [phone], [phone], and ask to speak to the surgeon on call for Dr. Kallie at [facility].

## 2024-04-04 NOTE — Transfer of Care (Signed)
 Immediate Anesthesia Transfer of Care Note  Patient: Shannon Ritter  Procedure(s) Performed: EXTENSIVE HEMORRHOIDECTOMY AND HEMORRHOIDAL SKIN TAG REMOVAL (Rectum) EXAM UNDER ANESTHESIA, RECTUM (Rectum)  Patient Location: PACU  Anesthesia Type:General  Level of Consciousness: awake, alert , oriented, and patient cooperative  Airway & Oxygen Therapy: Patient Spontanous Breathing and Patient connected to face mask oxygen  Post-op Assessment: Report given to RN and Post -op Vital signs reviewed and unstable, Anesthesiologist notified  Post vital signs: Reviewed  Last Vitals:  Vitals Value Taken Time  BP 148/118 04/04/24 12:37  Temp 36.7 C 04/04/24 12:35  Pulse 110 04/04/24 12:39  Resp 15 04/04/24 12:39  SpO2 100 % 04/04/24 12:39  Vitals shown include unfiled device data.  Last Pain:  Vitals:   04/04/24 1050  TempSrc: Oral  PainSc: 0-No pain      Patients Stated Pain Goal: 8 (04/04/24 1050)  Complications: No notable events documented.

## 2024-04-04 NOTE — Op Note (Signed)
 [facility] Operative Note  04/04/24  Preoperative Diagnosis: Anal discharge and rectal bleeding, h/o fistula    Postoperative Diagnosis: Grade II bleeding hemorrhoids, all three columns, left lateral hemorrhoidal tag    Procedure(s) Performed: Extensive open hemorrhoidectomy of the right anterior, right posterior, left lateral    Surgeon: Manuelita BROCKS. Kallie, MD   Assistants: No qualified resident was available    Anesthesia: General endotracheal   Anesthesiologist: Landry Dunnings, MD    Specimens: Right anterior, right posterior, left lateral columns    Estimated Blood Loss: Minimal   Blood Replacement: None    Complications: None   Wound Class: Dirty (stool in vault)    Operative Indications: Shannon Ritter is a 53 yo who has been having anal discharge and bleeding. 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 He came to the office with new onset bleeding and reported discharge for some time. He had no major issue with pain or discomfort. We discussed an exam under anesthesia, possible fistulotomy, possible seton placement, possible hemorrhoidectomy and risk of bleeding, infection, need for further procedures, injury to sphincter, inability to removal all the tissue.   Findings: Grade II hemorrhoids on all three columns with sequela of bleeding and left lateral hemorrhoidal tag    Procedure: The patient was taken to the operating room and placed supine. General endotracheal anesthesia was induced. Intravenous antibiotics were administered per protocol.  He was then placed in lithotomy with all pressure points padded. The perineum and perianal region were prepped and draped in the usual sterile fashion.   I digital exam revealed no signs of any masses. I inspected the skin around the anus and noted no signs of any fistula opening. He had hair follicles in the area that I propped to be sure there was no fistula.  I then inspected  with an anoscope.  I noted larger left lateral and right posterior hemorrhoids with sequela of bleeding. He also had a prominent right anterior hemorrhoid. He had a left lateral skin tag that we had noted in the office. Given the hemorrhoids and the signs of bleeding, I think the bleeding and discharge is likely from these hemorrhoids. There was stool in the vault that I evacuated and I cleaned the area with more betadine. With the anoscope in place, I elevated the left lateral hemorrhoid column. I incised the hemorrhoid tag and skin at the verge with cautery, and with elevation off the sphincter, took the hemorrhoidal column internal and external component with a ligasure. Ensuring there was adequate space between the left lateral a right posterior, did the same with the right posterior column.  In this area, there was some scarring from his prior fistulotomy and this scar tissue was removed.  I then investigated the right anterior column which had minimal external tissue but had a large internal component. I removed the right anterior internal column in the similar fashion ensuring that the anal skin was sufficient between the excised areas.  The sphincter was protected with each of the excisions. Hemostasis was ensured. More betadine was applied to the area. I then injected marcaine . A gelfoam with surgicel around it was placed into the anus. This was soaked with viscous lidocaine  and betadine and was tagged with a silk suture. This will be removed in the PACU or evacuated out with a bowel movement.   Final inspection revealed acceptable hemostasis. All counts were correct at the end of the case. The patient was  awakened from anesthesia and extubated without complication.  The patient went to the PACU in stable condition.   Manuelita Pander, MD [facility] [address], [place] [address] [phone] (office)

## 2024-04-04 NOTE — Interval H&P Note (Signed)
 History and Physical Interval Note:  04/04/2024 10:38 AM  Shannon Ritter  has presented today for surgery, with the diagnosis of RECTAL BLEEDING.  The various methods of treatment have been discussed with the patient and family. After consideration of risks, benefits and other options for treatment, the patient has consented to  Procedure(s) with comments: EXAM UNDER ANESTHESIA, RECTUM (N/A) ANAL FISTULOTOMY (N/A) HEMORRHOIDECTOMY (N/A) - SIMPLE as a surgical intervention.  The patient's history has been reviewed, patient examined, no change in status, stable for surgery.  I have reviewed the patient's chart and labs.  Questions were answered to the patient's satisfaction.     [name]

## 2024-04-04 NOTE — Progress Notes (Signed)
 [facility]  Updated his wife. Rx sent to pharmacy. Will have follow up in 4 weeks. Remove the packing from anus in pacu. No toradol  or nsaids for 5 days. Can resume nsaids 04/09/24.  Future Appointments  Date Time Provider Department Center  04/17/2024 10:00 AM [facility]  1 [facility]  [place] [facility]  05/02/2024  9:30 AM Kallie Manuelita BROCKS, MD [facility] None  06/12/2024  1:30 PM Shirlean Therisa ORN, NP [facility]    Sitz baths every 4-6 hours for the first 5 days and after Bms. Then periodically there after.   Manuelita Kallie, MD [facility] [address] [place], [place] [address] [phone] (office)

## 2024-04-04 NOTE — Anesthesia Preprocedure Evaluation (Signed)
 Anesthesia Evaluation  Patient identified by MRN, date of birth, ID band Patient awake    Reviewed: Allergy & Precautions, NPO status , Patient's Chart, lab work & pertinent test results  Airway Mallampati: II  TM Distance: >3 FB Neck ROM: Full    Dental  (+) Dental Advisory Given, Caps,    Pulmonary Current Smoker and Patient abstained from smoking.   Pulmonary exam normal breath sounds clear to auscultation       Cardiovascular Exercise Tolerance: Good hypertension, Pt. on medications Normal cardiovascular exam Rhythm:Regular Rate:Normal  Chest pain but very minimal CAD on CT angio in 2024   Neuro/Psych negative neurological ROS  negative psych ROS   GI/Hepatic ,GERD  Medicated,,(+)     substance abuse (drinks 6 to 12 drinks/day during weekends)  alcohol useRectal bleeding   Endo/Other  prediabetes  Renal/GU negative Renal ROS  negative genitourinary   Musculoskeletal negative musculoskeletal ROS (+)    Abdominal   Peds negative pediatric ROS (+)  Hematology negative hematology ROS (+)   Anesthesia Other Findings   Reproductive/Obstetrics negative OB ROS                              Anesthesia Physical Anesthesia Plan  ASA: 2  Anesthesia Plan: General   Post-op Pain Management: Minimal or no pain anticipated   Induction: Intravenous  PONV Risk Score and Plan: Propofol  infusion  Airway Management Planned: Nasal Cannula and Natural Airway  Additional Equipment: None  Intra-op Plan:   Post-operative Plan:   Informed Consent: I have reviewed the patients History and Physical, chart, labs and discussed the procedure including the risks, benefits and alternatives for the proposed anesthesia with the patient or authorized representative who has indicated his/her understanding and acceptance.     Dental advisory given  Plan Discussed with: CRNA and Surgeon  Anesthesia  Plan Comments:          Anesthesia Quick Evaluation

## 2024-04-05 ENCOUNTER — Encounter (HOSPITAL_COMMUNITY): Payer: Self-pay | Admitting: General Surgery

## 2024-04-05 LAB — SURGICAL PATHOLOGY

## 2024-04-06 ENCOUNTER — Telehealth: Payer: Self-pay | Admitting: Family Medicine

## 2024-04-06 NOTE — Telephone Encounter (Signed)
 STD & FMLA paperwork completed and faxed to [employer] at [phone]. Confirmation received.   Patient out of work starting on 04/04/2024 and may return unrestricted on 06/04/2024.

## 2024-04-07 ENCOUNTER — Telehealth (INDEPENDENT_AMBULATORY_CARE_PROVIDER_SITE_OTHER): Payer: Self-pay | Admitting: Surgery

## 2024-04-07 DIAGNOSIS — K625 Hemorrhage of anus and rectum: Secondary | ICD-10-CM

## 2024-04-07 NOTE — Telephone Encounter (Signed)
 [facility]  Called by patient's wife secondary to blood and clots with bowel movements.  Patient underwent an extensive open hemorrhoidectomy of the right anterior, right posterior, and left lateral grade 2 hemorrhoids on 11/19 with Dr. Kallie.  Starting yesterday, he was having loose bowel movements and diarrhea.  These have been his first bowel movements since surgery, and he denies taking any laxatives or anything else to help with bowel movements.  He did take Imodium earlier secondary to multiple loose bowel movements.  Initially he noted a little bit of blood and some intermittent clots with his bowel movements, but recently overnight, he has had more frequent loose bowel movements.  His wife is concerned because his most recent bowel movement appeared to be mostly blood with clots present.  She is unsure what his previous bowel movements consistencies looked like.  He is not passing any blood when not having a bowel movement, but wife is concerned that he is potentially bleeding and that is causing him to have more frequent loose bowel movements.  He feels weak but denies any lightheadedness.    I explained to the patient's wife that some bleeding and potentially clots with bowel movements is to be expected after the surgery.  Without knowing exactly how much he has bled, if he is continuing to have mostly bloody bowel movements with clots, I would recommend presentation to the emergency department for evaluation.  I explained that most likely he would be treated conservatively with supportive measures by checking his blood levels, evaluating for any active bleeding at bedside, and potentially placing a gelfoam roll within the anus.  Patient's wife will discuss with patient if he would like to proceed to the emergency department at this time.  All questions were answered to her expressed satisfaction.  Dorothyann Brittle, DO [facility] [address]  [place], [place] [address] [phone] (office)

## 2024-04-09 NOTE — Telephone Encounter (Signed)
 FMLA paperwork faxed to Orie [employer]ack at [phone]. Confirmation received.

## 2024-04-17 ENCOUNTER — Ambulatory Visit (HOSPITAL_COMMUNITY)
Admission: RE | Admit: 2024-04-17 | Discharge: 2024-04-17 | Disposition: A | Source: Ambulatory Visit | Attending: Gastroenterology | Admitting: Gastroenterology

## 2024-04-17 DIAGNOSIS — R7989 Other specified abnormal findings of blood chemistry: Secondary | ICD-10-CM | POA: Diagnosis present

## 2024-04-18 LAB — HEPATIC FUNCTION PANEL
ALT: 56 IU/L — ABNORMAL HIGH (ref 0–44)
AST: 40 IU/L (ref 0–40)
Albumin: 4.7 g/dL (ref 3.8–4.9)
Alkaline Phosphatase: 50 IU/L (ref 47–123)
Bilirubin Total: 0.3 mg/dL (ref 0.0–1.2)
Bilirubin, Direct: 0.12 mg/dL (ref 0.00–0.40)
Total Protein: 7.1 g/dL (ref 6.0–8.5)

## 2024-04-18 LAB — LIPID PANEL
Chol/HDL Ratio: 5 ratio (ref 0.0–5.0)
Cholesterol, Total: 175 mg/dL (ref 100–199)
HDL: 35 mg/dL — ABNORMAL LOW (ref >39)
LDL Chol Calc (NIH): 115 mg/dL — ABNORMAL HIGH (ref 0–99)
Triglycerides: 140 mg/dL (ref 0–149)
VLDL Cholesterol Cal: 25 mg/dL (ref 5–40)

## 2024-04-18 LAB — BASIC METABOLIC PANEL WITH GFR
BUN/Creatinine Ratio: 27 — ABNORMAL HIGH (ref 9–20)
BUN: 25 mg/dL — ABNORMAL HIGH (ref 6–24)
CO2: 22 mmol/L (ref 20–29)
Calcium: 10.2 mg/dL (ref 8.7–10.2)
Chloride: 101 mmol/L (ref 96–106)
Creatinine, Ser: 0.93 mg/dL (ref 0.76–1.27)
Glucose: 101 mg/dL — ABNORMAL HIGH (ref 70–99)
Potassium: 4.4 mmol/L (ref 3.5–5.2)
Sodium: 138 mmol/L (ref 134–144)
eGFR: 99 mL/min/1.73 (ref >59)

## 2024-04-25 LAB — ALPHA-1-ANTITRYPSIN DEFICIENCY

## 2024-04-25 LAB — ENHANCED LIVER FIBROSIS (ELF): ELF(TM) Score: 8.14 (ref <9.80)

## 2024-04-25 LAB — CERULOPLASMIN: Ceruloplasmin: 29.3 mg/dL (ref 16.0–31.0)

## 2024-04-25 LAB — IRON,TIBC AND FERRITIN PANEL
Ferritin: 351 ng/mL (ref 30–400)
Iron Saturation: 15 % (ref 15–55)
Iron: 54 ug/dL (ref 38–169)
Total Iron Binding Capacity: 356 ug/dL (ref 250–450)
UIBC: 302 ug/dL (ref 111–343)

## 2024-04-25 LAB — ANTI-MICROSOMAL ANTIBODY LIVER / KIDNEY: LKM1 Ab: 0.3 U (ref 0.0–20.0)

## 2024-04-25 LAB — HCV INTERPRETATION

## 2024-04-25 LAB — HEPATITIS A ANTIBODY, TOTAL: hep A Total Ab: NEGATIVE

## 2024-04-25 LAB — HCV AB W REFLEX TO QUANT PCR: HCV Ab: NONREACTIVE

## 2024-04-25 LAB — ANA W/REFLEX IF POSITIVE: Anti Nuclear Antibody (ANA): NEGATIVE

## 2024-04-25 LAB — CELIAC DISEASE PANEL
Immunoglobulin A, (IgA) QN, Serum: 147 mg/dL (ref 90–386)
t-Transglutaminase (tTG) IgA: <2 U/mL (ref 0–3)

## 2024-04-25 LAB — HEPATITIS B SURFACE ANTIBODY,QUALITATIVE: Hep B Surface Ab, Qual: NONREACTIVE

## 2024-04-25 LAB — MITOCHONDRIAL/SMOOTH MUSCLE AB PNL
Mitochondrial Ab: <20 U (ref 0.0–20.0)
Smooth Muscle Ab: 6 U (ref 0–19)

## 2024-04-25 LAB — HEPATITIS B SURFACE ANTIGEN: Hepatitis B Surface Ag: NEGATIVE

## 2024-04-25 LAB — IGG, IGA, IGM
IgG (Immunoglobin G), Serum: 879 mg/dL (ref 603–1613)
IgM (Immunoglobulin M), Srm: 109 mg/dL (ref 20–172)

## 2024-04-26 ENCOUNTER — Ambulatory Visit: Payer: Self-pay | Admitting: Gastroenterology

## 2024-05-02 ENCOUNTER — Ambulatory Visit: Admitting: General Surgery

## 2024-05-02 ENCOUNTER — Encounter: Payer: Self-pay | Admitting: General Surgery

## 2024-05-02 ENCOUNTER — Encounter: Payer: Self-pay | Admitting: Family Medicine

## 2024-05-02 VITALS — BP 118/84 | HR 81 | Temp 98.1°F | Resp 16 | Ht 70.0 in | Wt 202.0 lb

## 2024-05-02 DIAGNOSIS — R198 Other specified symptoms and signs involving the digestive system and abdomen: Secondary | ICD-10-CM

## 2024-05-02 DIAGNOSIS — K625 Hemorrhage of anus and rectum: Secondary | ICD-10-CM

## 2024-05-02 NOTE — Patient Instructions (Signed)
 Continue to keep your bowel movements regular and soft. Continue sitz baths as needed. Your discomfort will continue to improve. You can take tylenol  and ibuprofen. Give your pain and the plan for colonoscopy will extended your time to be out of work given the potential for worsening symptoms immediately after the colonoscopy. Will have you return to work without any restrictions 06/18/2024.

## 2024-05-02 NOTE — Progress Notes (Signed)
 [facility]  Doing well. Still with pain with sitting. Worried about his colonoscopy and pain after that procedure.   He has some minor drainage at times. '  BP 118/84   Pulse 81   Temp 98.1 F (36.7 C) (Oral)   Resp 16   Ht 5' 10 (1.778 m)   Wt 202 lb (91.6 kg)   SpO2 96%   BMI 28.98 kg/m  Perianal region healing, no signs of bleeding   Pathology: FINAL MICROSCOPIC DIAGNOSIS:   A. HEMORRHOID, LEFT LATERAL, HEMORRHOIDECTOMY:  - Benign hemorrhoidal tissue   B. HEMORRHOID, RIGHT POSTERIOR, HEMORRHOIDECTOMY:  - Benign hemorrhoidal tissue   C. HEMORRHOID, RIGHT ANTERIOR, HEMORRHOIDECTOMY:  - Benign hemorrhoidal tissue   Patient s/p extensive hemorrhoidectomy and skin tag removal. Doing well.   Continue to keep your bowel movements regular and soft. Continue sitz baths as needed. Your discomfort will continue to improve. You can take tylenol  and ibuprofen. Give your pain and the plan for colonoscopy will extended your time to be out of work given the potential for worsening symptoms immediately after the colonoscopy. Will have you return to work without any restrictions 06/18/2024.   Manuelita Pander, MD [facility] [address] [place] [address], [place] [address] [phone] (office)

## 2024-05-02 NOTE — Telephone Encounter (Addendum)
 Note extending out of work status faxed to [employer] and [employer]. Confirmation received for both.  New return to work date is 06/18/2024 without restrictions.

## 2024-05-15 NOTE — Progress Notes (Signed)
 Referral sent for Fibroscan ONLY

## 2024-05-17 ENCOUNTER — Encounter: Payer: Self-pay | Admitting: Gastroenterology

## 2024-06-12 ENCOUNTER — Telehealth: Payer: Self-pay | Admitting: *Deleted

## 2024-06-12 ENCOUNTER — Encounter: Payer: Self-pay | Admitting: *Deleted

## 2024-06-12 ENCOUNTER — Ambulatory Visit: Admitting: Gastroenterology

## 2024-06-12 VITALS — BP 107/72 | HR 73 | Temp 98.4°F | Ht 70.0 in | Wt 190.6 lb

## 2024-06-12 DIAGNOSIS — R7989 Other specified abnormal findings of blood chemistry: Secondary | ICD-10-CM

## 2024-06-12 DIAGNOSIS — R7401 Elevation of levels of liver transaminase levels: Secondary | ICD-10-CM

## 2024-06-12 DIAGNOSIS — K760 Fatty (change of) liver, not elsewhere classified: Secondary | ICD-10-CM

## 2024-06-12 DIAGNOSIS — K219 Gastro-esophageal reflux disease without esophagitis: Secondary | ICD-10-CM | POA: Diagnosis not present

## 2024-06-12 DIAGNOSIS — Z860101 Personal history of adenomatous and serrated colon polyps: Secondary | ICD-10-CM | POA: Diagnosis not present

## 2024-06-12 DIAGNOSIS — Z860100 Personal history of colon polyps, unspecified: Secondary | ICD-10-CM | POA: Insufficient documentation

## 2024-06-12 NOTE — Progress Notes (Signed)
 "   Gastroenterology Office Note     Primary Care Physician:  Marvine Rush, MD  Primary Gastroenterologist: Dr Cindie   Chief Complaint   Chief Complaint  Patient presents with   Follow-up    Follow up on elevated liver enzymes and GERD. Pt states nothing is bothering him today     History of Present Illness   Shannon Ritter is a 54 y.o. male presenting today with a history of rectal bleeding, sessile serrated polyp in 2023, recent extensive hemorrhoidectomy by Dr Kallie Mar 26, 2024, elevated ALT chronically, chronic GERD, last seen in Nov 2025 and here for follow-up.  He will need colonoscopy/EGD in the future and presents to arrange this. Colonoscopy due to hx of rectal bleeding and EGD for Barrett's screening. I personally spoke with Dr. Kallie today via text regarding his clearance; he may pursue colonoscopy without any concerns from a surgical standpoint.   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 Updated ELF, thorough serologies. ELF 8.14. Celiac labs are negative. All autoimmune labs are negative.Normal ceruloplasmin. No iron overload.not immune to Hep A or B. Hep C antibody negative. FH of alpha 1 antitrypsin deficiency in father who had double lung transplant. 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 Need actual alpha 1 levels.   Occasional ETOH. Only smoked when drinking. Vape pen.    Started Zepbound in December. A1 c 6.3 in Nov 2025. Purposeful weight loss and dietary changes. Sees nutritionist. Most recent HFP with ALT 56, AST 40, otherwise normal.    FIbroscan Jany 2026: F1/F2 hepatic fibrosis, unlikely to have significant portal hyeprtension.  US  abdomen complete Dec 2025: hepatic steatosis. Normal spleen.    No prior EGD.  FH alpha 1 antitrypsin deficiency in father: lung transplant    Past Medical History:  Diagnosis Date   Angina pectoris    GERD  (gastroesophageal reflux disease)    Hypertension    Pre-diabetes     Past Surgical History:  Procedure Laterality Date   ANTERIOR CRUCIATE LIGAMENT REPAIR Right    CARDIAC CATHETERIZATION     COLONOSCOPY WITH PROPOFOL  N/A 01/25/2022   Procedure: COLONOSCOPY WITH PROPOFOL ;  Surgeon: Cindie Carlin POUR, DO;  [facility];  Service: Endoscopy;  Laterality: N/A;  11:00AM   FISTULOTOMY N/A 03/16/2021   Procedure: FISTULOTOMY VERSUS SETON;  Surgeon: Kallie Manuelita BROCKS, MD;  Location: [facility];  Service: General;  Laterality: N/A;   HEMORRHOID SURGERY N/A 04/04/2024   Procedure: EXTENSIVE HEMORRHOIDECTOMY AND HEMORRHOIDAL SKIN TAG REMOVAL;  Surgeon: Kallie Manuelita BROCKS, MD;  Location: [facility];  Service: General;  Laterality: N/A;   POLYPECTOMY  01/25/2022   Procedure: POLYPECTOMY;  Surgeon: Cindie Carlin POUR, DO;  Location: [facility];  Service: Endoscopy;;   RECTAL EXAM UNDER ANESTHESIA  04/04/2024   Procedure: EXAM UNDER ANESTHESIA, RECTUM;  Surgeon: Kallie Manuelita BROCKS, MD;  Location: [facility];  Service: General;;    Current Outpatient Medications  Medication Sig Dispense Refill   ALPRAZolam (XANAX) 1 MG tablet Take 1 mg by mouth at bedtime as needed for sleep.     Ascorbic Acid (VITAMIN C PO) Take 1 tablet by mouth daily.     aspirin 81 MG tablet Take 81 mg by mouth daily.     losartan  (COZAAR ) 100 MG tablet Take 100 mg by mouth daily.     MAGNESIUM PO Take 1 tablet by mouth at bedtime.  melatonin 3 MG TABS tablet Take 3 mg by mouth at bedtime.     Multiple Vitamins-Minerals (MULTIVITAMIN ADULT PO) Take 1 tablet by mouth daily.     mupirocin ointment (BACTROBAN) 2 % Apply 1 Application topically 3 (three) times daily as needed (face).     nitroGLYCERIN  (NITROSTAT ) 0.4 MG SL tablet Place 1 tablet (0.4 mg total) under the tongue every 5 (five) minutes as needed for chest pain. If a single episode of chest pain is not relieved by one tablet, the patient will try another within 5  minutes; and if this doesn't relieve the pain, the patient is instructed to call 911 for transportation to an emergency department. 25 tablet 3   Omega-3 Fatty Acids (FISH OIL PO) Take 1 capsule by mouth daily.     ondansetron  (ZOFRAN ) 4 MG tablet Take 1 tablet (4 mg total) by mouth every 8 (eight) hours as needed. 27 tablet 1   oxyCODONE  (ROXICODONE ) 5 MG immediate release tablet Take 1 tablet (5 mg total) by mouth every 4 (four) hours as needed for severe pain (pain score 7-10) or breakthrough pain. 20 tablet 0   pantoprazole (PROTONIX) 40 MG tablet Take 40 mg by mouth daily.     tadalafil (CIALIS) 5 MG tablet Take 5 mg by mouth daily as needed.     tirzepatide (ZEPBOUND) 2.5 MG/0.5ML Pen Inject 2.5 mg into the skin once a week.     atorvastatin (LIPITOR) 20 MG tablet Take 20 mg by mouth daily.      cetirizine (ZYRTEC) 10 MG tablet Take 10 mg by mouth daily.     fenofibrate 160 MG tablet Take 160 mg by mouth daily.     hydrochlorothiazide (HYDRODIURIL) 25 MG tablet Take 25 mg by mouth daily.     No current facility-administered medications for this visit.    Allergies as of 06/12/2024   (No Known Allergies)    Family History  Problem Relation Age of Onset   Hyperlipidemia Mother    Hypertension Mother    Diabetes Mother    Cancer Father    Alpha-1 antitrypsin deficiency Father    Colon cancer Neg Hx     Social History   Socioeconomic History   Marital status: Married    Spouse name: Not on file   Number of children: Not on file   Years of education: Not on file   Highest education level: Not on file  Occupational History   Not on file  Tobacco Use   Smoking status: Some Days    Current packs/day: 0.25    Average packs/day: 0.3 packs/day for 30.0 years (7.5 ttl pk-yrs)    Types: Cigarettes    Passive exposure: Never   Smokeless tobacco: Never  Vaping Use   Vaping status: Never Used  Substance and Sexual Activity   Alcohol use: Yes    Alcohol/week: 36.0 standard  drinks of alcohol    Types: 36 Cans of beer per week    Comment: 2-3 times week   Drug use: Never   Sexual activity: Yes  Other Topics Concern   Not on file  Social History Narrative   Not on file   Social Drivers of Health   Tobacco Use: High Risk (05/02/2024)   Patient History    Smoking Tobacco Use: Some Days    Smokeless Tobacco Use: Never    Passive Exposure: Never  Financial Resource Strain: Not on file  Food Insecurity: Not on file  Transportation Needs: Not on file  Physical Activity: Not on file  Stress: Not on file  Social Connections: Unknown (09/29/2021)   Received from [facility]   Social Network    Social Network: Not on file  Intimate Partner Violence: Unknown (08/21/2021)   Received from [facility]   HITS    Physically Hurt: Not on file    Insult or Talk Down To: Not on file    Threaten Physical Harm: Not on file    Scream or Curse: Not on file  Depression (EYV7-0): Not on file  Alcohol Screen: Not on file  Housing: Not on file  Utilities: Not on file  Health Literacy: Not on file     Review of Systems   Gen: Denies any fever, chills, fatigue, weight loss, lack of appetite.  CV: Denies chest pain, heart palpitations, peripheral edema, syncope.  Resp: Denies shortness of breath at rest or with exertion. Denies wheezing or cough.  GI: Denies dysphagia or odynophagia. Denies jaundice, hematemesis, fecal incontinence. GU : Denies urinary burning, urinary frequency, urinary hesitancy MS: Denies joint pain, muscle weakness, cramps, or limitation of movement.  Derm: Denies rash, itching, dry skin Psych: Denies depression, anxiety, memory loss, and confusion Heme: Denies bruising, bleeding, and enlarged lymph nodes.   Physical Exam   BP 107/72   Pulse 73   Temp 98.4 F (36.9 C)   Ht 5' 10 (1.778 m)   Wt 190 lb 9.6 oz (86.5 kg)   BMI 27.35 kg/m  General:   Alert and oriented. Pleasant and cooperative. Well-nourished and well-developed.   Head:  Normocephalic and atraumatic. Eyes:  Without icterus Abdomen:  +BS, soft, non-tender and non-distended. No HSM noted. No guarding or rebound. No masses appreciated.  Rectal:  Deferred  Msk:  Symmetrical without gross deformities. Normal posture. Extremities:  Without edema. Neurologic:  Alert and  oriented x4;  grossly normal neurologically. Skin:  Intact without significant lesions or rashes. Psych:  Alert and cooperative. Normal mood and affect.   Assessment   Shannon Ritter is a 54 y.o. male presenting today with a history of  rectal bleeding, sessile serrated polyp in 2023, recent extensive hemorrhoidectomy by Dr Kallie Mar 26, 2024, mildly elevated transaminases chronically (ALT predominant), chronic GERD, last seen in Nov 2025 and here for follow-up.  Rectal bleeding resolved s/p hemorrhoidectomy. Suspect benign anorectal source. To be thorough and due to hx of sessile serrated polyp in 2023, arranging colonoscopy in near future.  GERD: no prior EGD. Arranging Barrett's screening in future. Continue pantoprazole daily.   Mildly elevated transaminases: ALT more predominant. Hepatic steatosis on US , fibroscan with F1/F2 fibrosis, ELF 8.14, extensive serologies as noted above and does show alpha 1 antitrypsin labs with heterozygous S allele detected that could indicated possible increase risk of symptoms if engaging in risk factors such as alcohol, smoking, etc. Father with history of alpha 1 antitrypsin deficiency and received bilateral lung transplant. I need to update actual levels, as this was not done previously; will benefit from referral to Pulmonology to be thorough in light of smoking history. Continue Zepbound and would hold on Rezdiffra right now as losing weight purposefully and seeing a nutritionist.    ~Colonoscopy for hx of rectal bleeding and polyps, EGD for Barrett's screening.  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  ~Avoid alcohol and smoking.  ~Continue excellent diet modifications.  ~Holding off on Rezdiffra right now.  ~ELF in 6 months, follow HFP serially ~  further recommendations to follow    Therisa MICAEL Stager, PhD, ANP-BC [facility]    "

## 2024-06-12 NOTE — Patient Instructions (Addendum)
 We are arranging a colonoscopy and upper endoscopy with Dr. Cindie or one of the other physicians on a Monday in the near future! You need to make sure to hold Zepbound one week prior (don't take on that Sunday).  I have ordered additional labs to have done.  After review of labs, we can decide about referral to Pulmonology.  It is important to avoid all smoking and alcohol as we discussed.  Keep up the good work with the diet changes!  Further recommendations to follow!  I enjoyed seeing you again today! I value our relationship and want to provide genuine, compassionate, and quality care. You may receive a survey regarding your visit with me, and I welcome your feedback! Thanks so much for taking the time to complete this. I look forward to seeing you again.      Therisa MICAEL Stager, PhD, ANP-BC [facility]

## 2024-06-12 NOTE — Telephone Encounter (Signed)
 LMOVM to return call  TCS/EGD w/Dr.Carver, asa 2,

## 2024-06-13 ENCOUNTER — Encounter: Payer: Self-pay | Admitting: *Deleted

## 2024-06-13 MED ORDER — PEG 3350-KCL-NA BICARB-NACL 420 G PO SOLR: 4000.0000 mL | Freq: Once | ORAL | 0 refills | Status: AC

## 2024-06-13 NOTE — Telephone Encounter (Signed)
 Pt has been scheduled for 07/09/24 with Dr.Ahmed due to needing a Monday. Per provider:  Dr. Cindie or one of the other physicians on a Monday in the near future. Instructions mailed and prep sent to pharmacy.

## 2024-06-13 NOTE — Addendum Note (Signed)
 Addended by: GAYLENE MADELIN CROME on: 06/13/2024 10:29 AM   Modules accepted: Orders

## 2024-06-13 NOTE — Telephone Encounter (Signed)
 [employer] PA for EGD: CPT Code: GEEGD Description: EGD-esophagogastroduodenoscopy Case Number: [id number] Review Date: 06/13/2024 10:38:30 AM Expiration Date: N/A Status: Your case has been sent to clinical review. You will be notified via fax within 2 business days if additional clinical information is needed. If you wish to speak with [employer] at anytime, please call [phone]. The prior authorization you submitted, Case [id number], has been received.

## 2024-06-13 NOTE — Telephone Encounter (Signed)
 LMOVM to return call  Pt left vm returning call

## 2024-06-13 NOTE — Telephone Encounter (Signed)
 [employer] PA:  Authorization Number: [id number] Case Number: [id number]      Patient Name: [name], [name] DOB: Sep 19, 1970 Status: Approved  P2P Status:  Referring Provider: CINDERELLA GRASS Referring Provider NPI: [id number] Approval Date: 06/13/2024 12:00:00 AM Service Code: 56764 Service Description: EGD transoral diagnostic Site Name: [facility]  Site NPI: [id number] Site Address: [address]  Site City: [place] Site State: [place] Site Zip: [phone] Start Date: 06/13/2024 Expiration Date: 12/10/2024

## 2024-07-05 ENCOUNTER — Encounter (HOSPITAL_COMMUNITY)

## 2024-07-09 ENCOUNTER — Ambulatory Visit (HOSPITAL_COMMUNITY): Admit: 2024-07-09 | Admitting: Gastroenterology

## 2024-07-09 ENCOUNTER — Encounter (HOSPITAL_COMMUNITY): Payer: Self-pay
